# Patient Record
Sex: Male | Born: 1970 | Race: White | Hispanic: No | Marital: Married | Smoking: Never smoker
Health system: Southern US, Community
[De-identification: ages and names within clinical notes are randomized; demographics above are authoritative.]

## PROBLEM LIST (undated history)

## (undated) DIAGNOSIS — F32A Depression, unspecified: Secondary | ICD-10-CM

## (undated) DIAGNOSIS — F329 Major depressive disorder, single episode, unspecified: Secondary | ICD-10-CM

## (undated) DIAGNOSIS — B019 Varicella without complication: Secondary | ICD-10-CM

## (undated) DIAGNOSIS — K635 Polyp of colon: Secondary | ICD-10-CM

## (undated) DIAGNOSIS — E669 Obesity, unspecified: Secondary | ICD-10-CM

## (undated) DIAGNOSIS — R7303 Prediabetes: Secondary | ICD-10-CM

## (undated) DIAGNOSIS — G4730 Sleep apnea, unspecified: Secondary | ICD-10-CM

## (undated) HISTORY — DX: Sleep apnea, unspecified: G47.30

## (undated) HISTORY — DX: Major depressive disorder, single episode, unspecified: F32.9

## (undated) HISTORY — DX: Depression, unspecified: F32.A

## (undated) HISTORY — DX: Varicella without complication: B01.9

## (undated) HISTORY — DX: Polyp of colon: K63.5

## (undated) HISTORY — DX: Obesity, unspecified: E66.9

---

## 1996-12-04 HISTORY — PX: ANKLE FRACTURE SURGERY: SHX122

## 1996-12-04 HISTORY — PX: KNEE ARTHROSCOPY W/ ACL RECONSTRUCTION AND PATELLA GRAFT: SHX1861

## 2006-12-04 DIAGNOSIS — G4730 Sleep apnea, unspecified: Secondary | ICD-10-CM

## 2006-12-04 HISTORY — DX: Sleep apnea, unspecified: G47.30

## 2010-05-20 ENCOUNTER — Ambulatory Visit: Payer: Self-pay | Admitting: Family Medicine

## 2010-05-20 DIAGNOSIS — Z9189 Other specified personal risk factors, not elsewhere classified: Secondary | ICD-10-CM | POA: Insufficient documentation

## 2010-05-20 DIAGNOSIS — L259 Unspecified contact dermatitis, unspecified cause: Secondary | ICD-10-CM

## 2010-05-20 DIAGNOSIS — F329 Major depressive disorder, single episode, unspecified: Secondary | ICD-10-CM

## 2010-06-03 ENCOUNTER — Telehealth: Payer: Self-pay | Admitting: Family Medicine

## 2010-06-07 ENCOUNTER — Telehealth: Payer: Self-pay | Admitting: Family Medicine

## 2010-09-21 ENCOUNTER — Encounter (INDEPENDENT_AMBULATORY_CARE_PROVIDER_SITE_OTHER): Payer: Self-pay | Admitting: *Deleted

## 2010-10-07 ENCOUNTER — Ambulatory Visit: Payer: Self-pay | Admitting: Internal Medicine

## 2010-10-07 DIAGNOSIS — R071 Chest pain on breathing: Secondary | ICD-10-CM

## 2010-10-11 ENCOUNTER — Ambulatory Visit: Payer: Self-pay | Admitting: Internal Medicine

## 2010-10-12 LAB — CONVERTED CEMR LAB
ALT: 24 units/L (ref 0–53)
BUN: 21 mg/dL (ref 6–23)
CO2: 31 meq/L (ref 19–32)
Chloride: 103 meq/L (ref 96–112)
Cholesterol: 156 mg/dL (ref 0–200)
Creatinine, Ser: 0.9 mg/dL (ref 0.4–1.5)
Eosinophils Relative: 4.1 % (ref 0.0–5.0)
Glucose, Bld: 99 mg/dL (ref 70–99)
MCV: 88.4 fL (ref 78.0–100.0)
Monocytes Absolute: 0.4 10*3/uL (ref 0.1–1.0)
Monocytes Relative: 8.1 % (ref 3.0–12.0)
Neutrophils Relative %: 63 % (ref 43.0–77.0)
Platelets: 257 10*3/uL (ref 150.0–400.0)
TSH: 2.03 microintl units/mL (ref 0.35–5.50)
Total Protein: 6.5 g/dL (ref 6.0–8.3)
Triglycerides: 77 mg/dL (ref 0.0–149.0)
WBC: 4.6 10*3/uL (ref 4.5–10.5)

## 2011-01-03 NOTE — Assessment & Plan Note (Signed)
Summary: chest pain since yesterday/alc   Vital Signs:  Patient profile:   40 year old male Weight:      355.75 pounds Temp:     98.6 degrees F oral Pulse rate:   76 / minute Pulse rhythm:   regular BP sitting:   118 / 80  (left arm) Cuff size:   large  Vitals Entered By: Selena Batten Dance CMA Duncan Dull) (October 07, 2010 12:25 PM) CC: Sharp chest pain when taking a deep breath    History of Present Illness: CC: chest pain  Last night 5pm had constant chest pain that lasted several hours on and off.  Felt sharp pain R sided next to sternum traveling underneath arm.  Driving when started.  not exertional, not relieved by rest.  No nausea/vomiting, no diaphoresis, tightness/pressure.  + pleuritic with deep breaths.  stretching R arm makes pain better.  Coughing/sneezing makes it worse.  Has tried ibuprofen so far which took edge off.  No coughing, SOB, fever.  No recent increase in exercise or heavy lifting.  Does walk on treadmill 15 min to warm up, does this a few times a week, no pain there.  no recent viral infection.  Still present with deep breaths only - sharp pain R lower sternum.  Never had anything like this before.  pulling muscle in back in past had similar effect.  Risk factors - obesity, steady at 355.  slowly increasing exercise.  told has normal cholesterol levels.  not HTN, DM, CRI.  No smokers at home.  no early fmhx CAD/MI.    Awaiting records from [place].  No fmhx blood clots, no recent immobility.  Current Medications (verified): 1)  Venlafaxine Hcl 75 Mg Tabs (Venlafaxine Hcl) .... One Tablet 2 Times Daily 2)  Triamcinolone Acetonide 0.5 % Crea (Triamcinolone Acetonide) .... Aaa Two Times A Day X 2 Weeks For Flares  Allergies (verified): No Known Drug Allergies  Past History:  Family History: Last updated: 05/20/2010 father: colon cancer..age 68 mother: healthy siblings: depression  Social History: Last updated: 05/20/2010 Occupation: Barrister's clerk Married 2 children: healthy Never Smoked Alcohol use-yes...2 drinks per month Drug use-no Regular exercise-yes.Marland Kitchen2-3 times a week..walking Diet: fruits and veggies, lean protein, water  Past Medical History: obesity PMH-FH-SH reviewed for relevance  Review of Systems       per HPI  Physical Exam  General:  morbidly obese in ,  VSS Head:  Normocephalic and atraumatic without obvious abnormalities. No apparent alopecia or balding. Mouth:  Oral mucosa and oropharynx without lesions or exudates.  Teeth in good repair. Neck:  no carotid bruit or thyromegaly no cervical or supraclavicular lymphadenopathy  Lungs:  Normal respiratory effort, chest expands symmetrically. Lungs are clear to auscultation, no crackles or wheezes. Heart:  Normal rate and regular rhythm. S1 and S2 normal without gallop, murmur, click, rub or other extra sounds. Abdomen:  soft, NTND, obese Pulses:  2+ rad pulses   Impression & Recommendations:  Problem # 1:  CHEST PAIN, PLEURITIC (ICD-786.52) noncardiac.  CXR to ensure not pulmonary in nature.  treat as MSK with flexeril, naprosyn.  RTC next week if not improved.  RTC for blood work to risk stratify (awaiting records from [place], not in chart).  bp good today.  CXR looks ok to my read, sent patient home.  will call if abnormal by radiology  The following medications were removed from the medication list:    Oxaprozin 600 Mg Tabs (Oxaprozin) ..... Once daily  Etodolac 500 Mg Tabs (Etodolac) ..... Not taking right now His updated medication list for this problem includes:    Naprosyn 500 Mg Tabs (Naproxen) .Marland Kitchen... Take one by mouth two times a day x 5 days then as needed  Orders: T-2 View CXR (71020TC)  Complete Medication List: 1)  Venlafaxine Hcl 75 Mg Tabs (Venlafaxine hcl) .... One tablet 2 times daily 2)  Triamcinolone Acetonide 0.5 % Crea (Triamcinolone acetonide) .... Aaa two times a day x 2 weeks for flares 3)  Flexeril 10 Mg Tabs  (Cyclobenzaprine hcl) .... Take one by mouth two times a day x 5 days then as needed (sedation) 4)  Naprosyn 500 Mg Tabs (Naproxen) .... Take one by mouth two times a day x 5 days then as needed  Patient Instructions: 1)  this sounds muscular. return next week if not better. 2)  treat with flexeril and naprosyn daily for next 5 days then as needed.  watch out for sedation with flexeril. 3)  Return next week fasting (nothing to eat or drink after midnight) for blood work [FLP, CMP, TSH, CBC 278.00, 786.52] 4)  continue stretching of chest. Prescriptions: NAPROSYN 500 MG TABS (NAPROXEN) take one by mouth two times a day x 5 days then as needed  #30 x 0   Entered and Authorized by:   Eustaquio Boyden  MD   Signed by:   Eustaquio Boyden  MD on 10/07/2010   Method used:   Electronically to        [facility] ([facility]ail)       [address]       [place], [place]  [address]       Ph: [phone]       Fax: [phone]   RxID:   [id number] FLEXERIL 10 MG TABS (CYCLOBENZAPRINE HCL) take one by mouth two times a day x 5 days then as needed (sedation)  #30 x 0   Entered and Authorized by:   Eustaquio Boyden  MD   Signed by:   Eustaquio Boyden  MD on 10/07/2010   Method used:   Electronically to        [facility] (ret[facility])       [address]       [place], [place]  [address]       Ph: [phone]       Fax: [phone]   RxID:   [id number]    Orders Added: 1)  Est. Patient Level III [06301] 2)  T-2 View CXR [71020TC]    Current Allergies (reviewed today): No known allergies

## 2011-01-03 NOTE — Progress Notes (Signed)
Summary: wants steroid cream  Phone Note Call from Patient Call back at [phone]   Caller: Patient Call For: Kerby Nora MD Summary of Call: Pt is asking for a steroid cream for the rash on his hands.  He has been using cortisone 10 but that isnt helping.  [facility] [address]. Initial call taken by: Lowella Petties CMA,  June 03, 2010 3:46 PM  Follow-up for Phone Call        Patient advised.Consuello Masse CMA   Follow-up by: Benny Lennert CMA (AAMA),  June 03, 2010 5:25 PM    New/Updated Medications: TRIAMCINOLONE ACETONIDE 0.5 % CREA (TRIAMCINOLONE ACETONIDE) AAA two times a day x 2 weeks for flares Prescriptions: TRIAMCINOLONE ACETONIDE 0.5 % CREA (TRIAMCINOLONE ACETONIDE) AAA two times a day x 2 weeks for flares  #30 gm x 0   Entered and Authorized by:   Kerby Nora MD   Signed by:   Kerby Nora MD on 06/03/2010   Method used:   Electronically to        [id number] (retail)       [address]       [place], [place]  [address]       Ph: [phone]       Fax: [phone]   RxID:   [id number]

## 2011-01-03 NOTE — Progress Notes (Signed)
Summary: call a nurse  Phone Note Call from Patient   Summary of Call: Triage Record Num: [id number] Operator: Karenann Cai Patient Name: David Hansen Call Date & Time: 06/05/2010 12:13:07PM Patient Phone: [phone] PCP: Kerby Nora Patient Gender: Male PCP Fax : Patient DOB: [birth year] Practice Name: [facility] Reason for Call: Patient is calling to follow up on prescription: steroid cream. Prescription was to have been sent to [facility] [address] on Friday (06/03/2010). RN advised patient to call office staff on Tuesday (06/07/2010). Protocol(s) Used: Office Note Recommended Outcome per Protocol: Information Noted and Sent to Office Reason for Outcome: Caller information to office Care Advice:  ~ 06/05/2010 12:20:32PM Page 1 of 1 [email or web address] Initial call taken by: Melody Comas,  June 07, 2010 9:27 AM     Appended Document: call a nurse Please verify prescription recieved by Pt..or resend.   Appended Document: call a nurse Prescription sent to wrong pharmacy.Consuello Masse CMA

## 2011-01-03 NOTE — Assessment & Plan Note (Signed)
Summary: NEW PT TO EST/CLE   Vital Signs:  Patient profile:   40 year old male Height:      72 inches Weight:      355.4 pounds BMI:     48.38 Temp:     97.6 degrees F oral Pulse rate:   76 / minute Pulse rhythm:   regular BP sitting:   118 / 80  (left arm) Cuff size:   regular  History of Present Illness: Chief complaint New Patient  Depression, ongoing in last 10 years: well controlled on effexor 75 mg. No SI. Some stress in schedule..sleeping fairly well at night.   Started daypro (oxaprozin) few weeks ago following ankle sprain. Has history of old ankle football injury. Ankle..gradually improving..swelling down, wearing brace.  Thought had allergic reaction to etodolac.. eye itching, swelling around eyes. On Zyrtec.  Eyelids dry, itchy..now on oxaprozin as well..not sure from med or external allergy.  Preventive Screening-Counseling & Management  Alcohol-Tobacco     Smoking Status: never  Caffeine-Diet-Exercise     Does Patient Exercise: yes      Drug Use:  no.    Allergies (verified): No Known Drug Allergies  Past History:  Past medical, surgical, family and social histories (including risk factors) reviewed, and no changes noted (except as noted below).  Past Surgical History: ACL surgery 1998  Family History: Reviewed history and no changes required. father: colon cancer..age 32 mother: healthy siblings: depression  Social History: Reviewed history and no changes required. 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 CV:  Denies chest pain or discomfort. Resp:  Denies shortness of breath. GI:  Denies abdominal pain, bloody stools,  constipation, diarrhea, and indigestion. GU:  Denies dysuria.  Physical Exam  General:  morbidly obeses in NAD  Ears:  External ear exam shows no significant lesions or deformities.  Otoscopic examination reveals clear canals, tympanic membranes are intact bilaterally without bulging, retraction, inflammation or discharge. Hearing is grossly normal bilaterally. Nose:  External nasal examination shows no deformity or inflammation. Nasal mucosa are pink and moist without lesions or exudates. Mouth:  Oral mucosa and oropharynx without lesions or exudates.  Teeth in good repair. Neck:  no carotid bruit or thyromegaly no cervical or supraclavicular lymphadenopathy  Lungs:  Normal respiratory effort, chest expands symmetrically. Lungs are clear to auscultation, no crackles or wheezes. Heart:  Normal rate and regular rhythm. S1 and S2 normal without gallop, murmur, click, rub or other extra sounds. Abdomen:  Bowel sounds positive,abdomen soft and non-tender without masses, organomegaly or hernias noted. Msk:  No deformity or scoliosis noted of thoracic or lumbar spine.   Pulses:  R and L posterior tibial pulses are full and equal bilaterally  Extremities:  no edmea  Neurologic:  No cranial nerve deficits noted. Station and gait are normal. Plantar reflexes are down-going bilaterally. DTRs are symmetrical throughout. Sensory, motor and coordinative functions appear intact. Skin:  dry flacky skin on eyelids  Raised erythematous patches on backs of hands, flacky Psych:  Cognition and judgment appear intact. Alert and cooperative with normal attention span and concentration. No apparent delusions, illusions, hallucinations   Impression & Recommendations:  Problem # 1:  DEPRESSION (ICD-311) Well controlled. Continue current medication.  His updated medication  list for this problem includes:    Venlafaxine Hcl 75 Mg Tabs (Venlafaxine hcl) ..... One tablet 2 times daily    Hydroxyzine Hcl 10 Mg Tabs  (Hydroxyzine hcl) ..... Once daily  Problem # 2:  DERMATITIS, HANDS (ICD-692.9) Using polysporin and vaseline.  Recommended 2% hydrocortisone cream two times a day OTC.Marland Kitchenif minimal improviement with treat with stronger steroid cream.   Complete Medication List: 1)  Venlafaxine Hcl 75 Mg Tabs (Venlafaxine hcl) .... One tablet 2 times daily 2)  Oxaprozin 600 Mg Tabs (Oxaprozin) .... Once daily 3)  Hydroxyzine Hcl 10 Mg Tabs (Hydroxyzine hcl) .... Once daily 4)  Etodolac 500 Mg Tabs (Etodolac) .... Not taking right now  Patient Instructions: 1)  Please schedule a follow-up appointment in 1 year.  2)  Call if interested in stronger steroid cream for hands.  3)  It is important that you exercise reguarly at least 20 minutes 5 times a week. If you develop chest pain, have severe difficulty breathing, or feel very tired, stop exercising immediately and seek medical attention.  4)  You need to lose weight. Consider a lower calorie diet and regular exercise.   Current Allergies (reviewed today): No known allergies

## 2011-01-03 NOTE — Miscellaneous (Signed)
Summary: [facility] ([facility])  Clinical Lists Changes  Observations: Added new observation of FLU VAX: Historical([facility]) (09/13/2010 14:09)      Immunization History:  Influenza Immunization History:    Influenza:  historical([facility]) (09/13/2010)

## 2011-02-15 ENCOUNTER — Telehealth: Payer: Self-pay | Admitting: Family Medicine

## 2011-02-21 NOTE — Progress Notes (Signed)
Summary: refills needed for effexor XR  Phone Note From Pharmacy   Caller: [facility] Summary of Call: Pt needs refills on venlafaxine ER 75 mg's.  He states he takes one a day, chart has that he takes plain, 2 a day.  [facility]. Plain effexor was just sent to pharmacy    Lowella Petties CMA, AAMA  February 15, 2011 4:08 PM     Follow-up for Phone Call        Pharmacy advised not to fell plain right now Follow-up by: Benny Lennert CMA Duncan Dull),  February 15, 2011 4:20 PM  Additional Follow-up for Phone Call Additional follow up Details #1::        An error was brought to my attention by Ms. Lowella Petties.   This plain effexor was sent to pharmacy incorrectly by a different CMA Benny Lennert), when this note was meant to be held until Dr. Ermalene Searing could evaluate this medication discrepancy in its entirety.  I think this needs to be brought up and explored further. At this point, pharmacy has been called and medication refill has been recinded.  Additional Follow-up by: Hannah Beat MD,  February 15, 2011 5:29 PM    Additional Follow-up for Phone Call Additional follow up Details #2::    We did not place this pt on his med... so.. it was likely entered in computer incorrectly at new pt visit... Heather please call pt to see if he is taking effexor XR once daily or if it really is effexor short acting only once daily. I would recommend to him that if he is taking the latter that he is only gettiong  1/2 day's coverege.  Follow-up by: Kerby Nora MD,  February 15, 2011 11:02 PM  Additional Follow-up for Phone Call Additional follow up Details #3:: Details for Additional Follow-up Action Taken: Patient said that he takes effexor xr 75mg  once a day and medication list updated  as this has been verified, will refill for cross-coverage for aeb. Monty Spicher MD  February 16, 2011 12:19 PM  Additional Follow-up by: Benny Lennert CMA Duncan Dull),  February 16, 2011 8:20  AM  New/Updated Medications: EFFEXOR XR 75 MG XR24H-CAP (VENLAFAXINE HCL) take one tablet daily Prescriptions: EFFEXOR XR 75 MG XR24H-CAP (VENLAFAXINE HCL) take one tablet daily  #30 x 2   Entered and Authorized by:   Hannah Beat MD   Signed by:   Hannah Beat MD on 02/16/2011   Method used:   Electronically to        [facility] ([facility])       [address]       [place], [place]  [address]       Ph: [phone]       Fax: [phone]   RxID:   [id number]

## 2011-05-09 ENCOUNTER — Other Ambulatory Visit: Payer: Self-pay | Admitting: *Deleted

## 2011-05-09 NOTE — Telephone Encounter (Signed)
Ok to refill 

## 2011-05-09 NOTE — Telephone Encounter (Signed)
Due for CPX, have him schedule. May refill prescription until appt date.

## 2011-05-10 NOTE — Telephone Encounter (Signed)
Left message on machine at home for patient to call and schedule CPX.

## 2011-05-16 ENCOUNTER — Encounter: Payer: Self-pay | Admitting: Family Medicine

## 2011-05-17 ENCOUNTER — Encounter: Payer: Self-pay | Admitting: Gastroenterology

## 2011-05-17 ENCOUNTER — Ambulatory Visit (INDEPENDENT_AMBULATORY_CARE_PROVIDER_SITE_OTHER)
Admission: RE | Admit: 2011-05-17 | Discharge: 2011-05-17 | Disposition: A | Discharge status: Home / Self Care | Payer: PRIVATE HEALTH INSURANCE | Source: Ambulatory Visit | Attending: Family Medicine | Admitting: Family Medicine

## 2011-05-17 ENCOUNTER — Ambulatory Visit (INDEPENDENT_AMBULATORY_CARE_PROVIDER_SITE_OTHER): Payer: PRIVATE HEALTH INSURANCE | Admitting: Family Medicine

## 2011-05-17 ENCOUNTER — Encounter: Payer: Self-pay | Admitting: Family Medicine

## 2011-05-17 VITALS — BP 120/78 | HR 76 | Temp 98.2°F | Ht 72.0 in | Wt 356.8 lb

## 2011-05-17 DIAGNOSIS — M25561 Pain in right knee: Secondary | ICD-10-CM

## 2011-05-17 DIAGNOSIS — M25569 Pain in unspecified knee: Secondary | ICD-10-CM

## 2011-05-17 DIAGNOSIS — F329 Major depressive disorder, single episode, unspecified: Secondary | ICD-10-CM

## 2011-05-17 DIAGNOSIS — Z1211 Encounter for screening for malignant neoplasm of colon: Secondary | ICD-10-CM

## 2011-05-17 DIAGNOSIS — Z800 Family history of malignant neoplasm of digestive organs: Secondary | ICD-10-CM

## 2011-05-17 DIAGNOSIS — F3289 Other specified depressive episodes: Secondary | ICD-10-CM

## 2011-05-17 MED ORDER — VENLAFAXINE HCL ER 37.5 MG PO CP24: 37.5000 mg | ORAL_CAPSULE | Freq: Every day | ORAL | Status: DC

## 2011-05-17 MED ORDER — DICLOFENAC SODIUM 75 MG PO TBEC: 75.0000 mg | DELAYED_RELEASE_TABLET | Freq: Two times a day (BID) | ORAL | Status: AC

## 2011-05-17 NOTE — Patient Instructions (Addendum)
Resume rehab Monday  Ice knee also if hurting   Effexor taper: Take 1 effexor 37.5 mg tablet po daily for 2 weeks, then 1 every other day for 2 weeks, then stop  REFERRAL: GO THE THE FRONT ROOM AT THE ENTRANCE OF OUR CLINIC, NEAR CHECK IN. ASK FOR MARION. SHE WILL HELP YOU SET UP YOUR REFERRAL. DATE: TIME:

## 2011-05-17 NOTE — Progress Notes (Signed)
40 year old male:  Effexor - wants to taper off Lexapro for 4-5 years ago. Then effexor for 5 years or so Reports no "real depression" had some decreased interest and activities  Patient presents with 3 week day h/o r sided knee pain after no specific injury. Was on the floor with children. No audible pop was heard. The patient has had an effusion. No symptomatic giving-way. No mechanical clicking. Some crepitus. Joint has not locked up. Patient has been able to walk but is limping. The patient does not have pain going up and down stairs or rising from a seated position.   Pain location: posterior and deep Current physical activity: minimal Prior Knee Surgery: R ACL, BTB graft Current pain meds: motrin Bracing: none  ACL reconstruction, 1992 - patellar graft 3-4 weeks ago, on the right knee, would hear some popping and clicking on the R knee. Now having some pain in his right knee.   Patient Active Problem List  Diagnoses  . OBESITY, MORBID  . DEPRESSION  . DERMATITIS, HANDS  . CHEST PAIN, PLEURITIC  . CHICKENPOX, HX OF  . Family history of colon cancer  . Colon cancer screening   Past Medical History  Diagnosis Date  . Obesity    Past Surgical History  Procedure Date  . Knee arthroscopy w/ acl reconstruction and patella graft 1998    [facility] in [place]   History  Substance Use Topics  . Smoking status: Never Smoker   . Smokeless tobacco: Not on file  . Alcohol Use: Yes   Family History  Problem Relation Age of Onset  . Cancer Father 14    colon cancer   No Known Allergies Current Outpatient Prescriptions on File Prior to Visit  Medication Sig Dispense Refill  . triamcinolone (KENALOG) 0.5 % cream Apply 1 application topically 2 (two) times daily. For flares       . DISCONTD: venlafaxine (EFFEXOR-XR) 75 MG 24 hr capsule Take 75 mg by mouth daily.         REVIEW OF SYSTEMS  GEN: No fevers, chills. Nontoxic. Primarily MSK c/o today. MSK: Detailed in the  HPI GI: tolerating PO intake without difficulty Neuro: No numbness, parasthesias, or tingling associated. Otherwise the pertinent positives of the ROS are noted above.    Physical Exam  Blood pressure 120/78, pulse 76, temperature 98.2 F (36.8 C), temperature source Oral, height 6' (1.829 m), weight 356 lb 12.8 oz (161.843 kg), SpO2 98.00%.  GEN: Well-developed,well-nourished,in no acute distress; alert,appropriate and cooperative throughout examination HEENT: Normocephalic and atraumatic without obvious abnormalities. Ears, externally no deformities PULM: Breathing comfortably in no respiratory distress EXT: No clubbing, cyanosis, or edema PSYCH: Normally interactive. Cooperative during the interview. Pleasant. Friendly and conversant. Not anxious or depressed appearing. Normal, full affect.  Gait: Normal heel toe pattern ROM: 0 - 125 Effusion: R mild - mod Echymosis or edema: none Patellar tendon NT Painful PLICA: neg Patellar grind: negative Medial and lateral patellar facet loading: negative medial and lateral joint lines:NT Mcmurray's neg Flexion-pinch pos Varus and valgus stress: stable Lachman: neg Ant and Post drawer: neg Hip abduction, IR, ER: WNL Hip flexion str: 5/5 Hip abd: 5/5 Quad: 5/5 VMO atrophy:No Hamstring concentric and eccentric: 5/5  A/P: 1. Knee pain, r: suspect meniscal contusion, could be mild OA flare. Unlikely any occult derangement. Activity mod, injection, change to voltaren. Well acquainted with knee rehab from ACL reconstruction distantly  X-rays: AP Bilateral Weight-bearing, Weightbearing Lateral, Sunrise views Indication: knee pain Findings:  relatively well preserved joint space with minimal loss on the R  Knee Injection, R Patient verbally consented to procedure. Risks, benefits, and alternatives explained. Sterilely prepped with betadine. Ethyl cholride used for anesthesia. 9 cc Lidocaine 1% mixed with 1 cc of Kenalog 40 mg injected  using the anterolateral approach without difficulty. No complications with procedure and tolerated well. Patient had decreased pain post-injection.   2. DEP: reviewed how to taper down, start effexor xr 37.5, then taper off  3. Fhx of Colon CA, screening, Father dx at age 59

## 2011-06-02 ENCOUNTER — Other Ambulatory Visit: Payer: Self-pay | Admitting: *Deleted

## 2011-06-02 MED ORDER — TRIAMCINOLONE ACETONIDE 0.5 % EX CREA: 1.0000 "application " | TOPICAL_CREAM | Freq: Two times a day (BID) | CUTANEOUS | Status: DC

## 2011-06-02 NOTE — Telephone Encounter (Signed)
Overdue for CPX.Marland Kitchen Please have him schedule.

## 2011-06-02 NOTE — Telephone Encounter (Signed)
Pt states the rash on his hands has come back and he is requesting a refill.

## 2011-06-02 NOTE — Telephone Encounter (Signed)
Patient advised medication called in and also to return my call the schedule appt

## 2011-06-05 NOTE — Telephone Encounter (Signed)
Patient will call back to schedule appt.

## 2011-06-20 ENCOUNTER — Ambulatory Visit (INDEPENDENT_AMBULATORY_CARE_PROVIDER_SITE_OTHER): Payer: PRIVATE HEALTH INSURANCE | Admitting: Family Medicine

## 2011-06-20 ENCOUNTER — Encounter: Payer: Self-pay | Admitting: Family Medicine

## 2011-06-20 VITALS — BP 120/72 | HR 78 | Temp 98.6°F | Ht 72.0 in | Wt 356.4 lb

## 2011-06-20 DIAGNOSIS — F329 Major depressive disorder, single episode, unspecified: Secondary | ICD-10-CM

## 2011-06-20 DIAGNOSIS — F3289 Other specified depressive episodes: Secondary | ICD-10-CM

## 2011-06-20 DIAGNOSIS — J029 Acute pharyngitis, unspecified: Secondary | ICD-10-CM

## 2011-06-20 NOTE — Progress Notes (Signed)
  Subjective:    Patient ID: David Hansen, male    DOB: 1971-10-17, 40 y.o.   MRN: [id number]  Sore Throat  This is a new problem. The current episode started yesterday. The problem has been gradually worsening. Neither side of throat is experiencing more pain than the other. There has been no fever. The pain is at a severity of 5/10. The pain is moderate. Associated symptoms include congestion and swollen glands. Pertinent negatives include no coughing, drooling, ear discharge, ear pain, headaches, shortness of breath, trouble swallowing or vomiting. Associated symptoms comments: General body ache. He has had no exposure to strep or mono. He has tried gargles (Claritin, salt water gargle) for the symptoms. The treatment provided mild relief.      Review of Systems  Constitutional: Negative for fever and fatigue.  HENT: Positive for congestion. Negative for ear pain, drooling, trouble swallowing and ear discharge.   Eyes: Negative for pain.  Respiratory: Negative for cough and shortness of breath.   Gastrointestinal: Negative for vomiting.  Neurological: Negative for headaches.  Psychiatric/Behavioral: Negative for suicidal ideas, behavioral problems, confusion, dysphoric mood and agitation. The patient is not nervous/anxious.        Doing well tapering off effexor for depression       Objective:   Physical Exam  Constitutional: Vital signs are normal. He appears well-developed and well-nourished.  Non-toxic appearance. He does not appear ill. No distress.  HENT:  Head: Normocephalic and atraumatic.  Right Ear: Hearing, tympanic membrane, external ear and ear canal normal. No tenderness. No foreign bodies. Tympanic membrane is not retracted and not bulging.  Left Ear: Hearing, tympanic membrane, external ear and ear canal normal. No tenderness. No foreign bodies. Tympanic membrane is not retracted and not bulging.  Nose: Nose normal. No mucosal edema or rhinorrhea. Right sinus exhibits  no maxillary sinus tenderness and no frontal sinus tenderness. Left sinus exhibits no maxillary sinus tenderness and no frontal sinus tenderness.  Mouth/Throat: Uvula is midline, oropharynx is clear and moist and mucous membranes are normal. Normal dentition. No dental caries. No oropharyngeal exudate or tonsillar abscesses.  Eyes: Conjunctivae, EOM and lids are normal. Pupils are equal, round, and reactive to light. No foreign bodies found.  Neck: Trachea normal, normal range of motion and phonation normal. Neck supple. Carotid bruit is not present. No mass and no thyromegaly present.  Cardiovascular: Normal rate, regular rhythm, S1 normal, S2 normal, normal heart sounds, intact distal pulses and normal pulses.  Exam reveals no gallop.   No murmur heard. Pulmonary/Chest: Effort normal and breath sounds normal. No respiratory distress. He has no wheezes. He has no rhonchi. He has no rales.  Abdominal: Soft. Normal appearance and bowel sounds are normal. There is no hepatosplenomegaly. There is no tenderness. There is no rebound, no guarding and no CVA tenderness. No hernia.  Neurological: He is alert. He has normal reflexes.  Skin: Skin is warm, dry and intact. No rash noted.  Psychiatric: He has a normal mood and affect. His speech is normal and behavior is normal. Judgment normal.          Assessment & Plan:

## 2011-06-20 NOTE — Assessment & Plan Note (Signed)
Strep negative. Symtpmatic care. Follow up as needed.

## 2011-06-20 NOTE — Assessment & Plan Note (Signed)
Doing well taperong off this medicaiton.

## 2011-06-20 NOTE — Patient Instructions (Signed)
V[facility]: Start tylenol or ibuprofen for sore throat and body pains. Rest, push fluids Expect symptoms to last 7-10 days, call if not gradually turning the corner by day 5 of illness. May develop more congestion or cough before better. Call if difficulty swallowing liquids and solids or shortness of breath or sooner if other questions.

## 2011-06-23 ENCOUNTER — Ambulatory Visit (AMBULATORY_SURGERY_CENTER): Payer: PRIVATE HEALTH INSURANCE | Admitting: *Deleted

## 2011-06-23 VITALS — Ht 72.0 in | Wt 349.0 lb

## 2011-06-23 DIAGNOSIS — Z1211 Encounter for screening for malignant neoplasm of colon: Secondary | ICD-10-CM

## 2011-06-23 MED ORDER — PEG-KCL-NACL-NASULF-NA ASC-C 100 G PO SOLR: ORAL | Status: DC

## 2011-06-27 ENCOUNTER — Encounter: Payer: Self-pay | Admitting: Gastroenterology

## 2011-07-07 ENCOUNTER — Other Ambulatory Visit: Payer: PRIVATE HEALTH INSURANCE | Admitting: Gastroenterology

## 2011-07-27 ENCOUNTER — Ambulatory Visit (AMBULATORY_SURGERY_CENTER): Payer: PRIVATE HEALTH INSURANCE | Admitting: Gastroenterology

## 2011-07-27 ENCOUNTER — Encounter: Payer: Self-pay | Admitting: Gastroenterology

## 2011-07-27 VITALS — BP 125/63 | HR 64 | Temp 97.7°F | Resp 16 | Ht 72.0 in | Wt 349.0 lb

## 2011-07-27 DIAGNOSIS — Z800 Family history of malignant neoplasm of digestive organs: Secondary | ICD-10-CM

## 2011-07-27 DIAGNOSIS — D126 Benign neoplasm of colon, unspecified: Secondary | ICD-10-CM

## 2011-07-27 DIAGNOSIS — Z1211 Encounter for screening for malignant neoplasm of colon: Secondary | ICD-10-CM

## 2011-07-27 DIAGNOSIS — K635 Polyp of colon: Secondary | ICD-10-CM

## 2011-07-27 HISTORY — DX: Polyp of colon: K63.5

## 2011-07-27 HISTORY — PX: COLONOSCOPY: SHX174

## 2011-07-27 MED ORDER — SODIUM CHLORIDE 0.9 % IV SOLN: 500.0000 mL | INTRAVENOUS | Status: DC

## 2011-07-27 NOTE — Patient Instructions (Signed)
Polyp removed today. Repeat test in 5 years. Please refer to blue and green discharge instruction sheets for safety and diet precautions today.

## 2011-07-28 ENCOUNTER — Telehealth: Payer: Self-pay

## 2011-07-28 NOTE — Telephone Encounter (Signed)
I called the home # and got the answering machine.  Called Cell # message picked up, but no ID.  Called the work # and ID was on voice mail and left message to call if any questions or concerns.  MAW

## 2011-08-01 ENCOUNTER — Encounter: Payer: Self-pay | Admitting: Gastroenterology

## 2011-09-15 ENCOUNTER — Other Ambulatory Visit: Payer: Self-pay | Admitting: Family Medicine

## 2011-11-04 IMAGING — CR DG KNEE 1-2V*R*
2 series · 2 of 2 positions shown · non-contrast
Comparison: 05/17/2011

CLINICAL DATA: Right knee pain.  History of ACL reconstruction.

RIGHT KNEE - 1-2 VIEW

[view not recorded (1 of 2)]
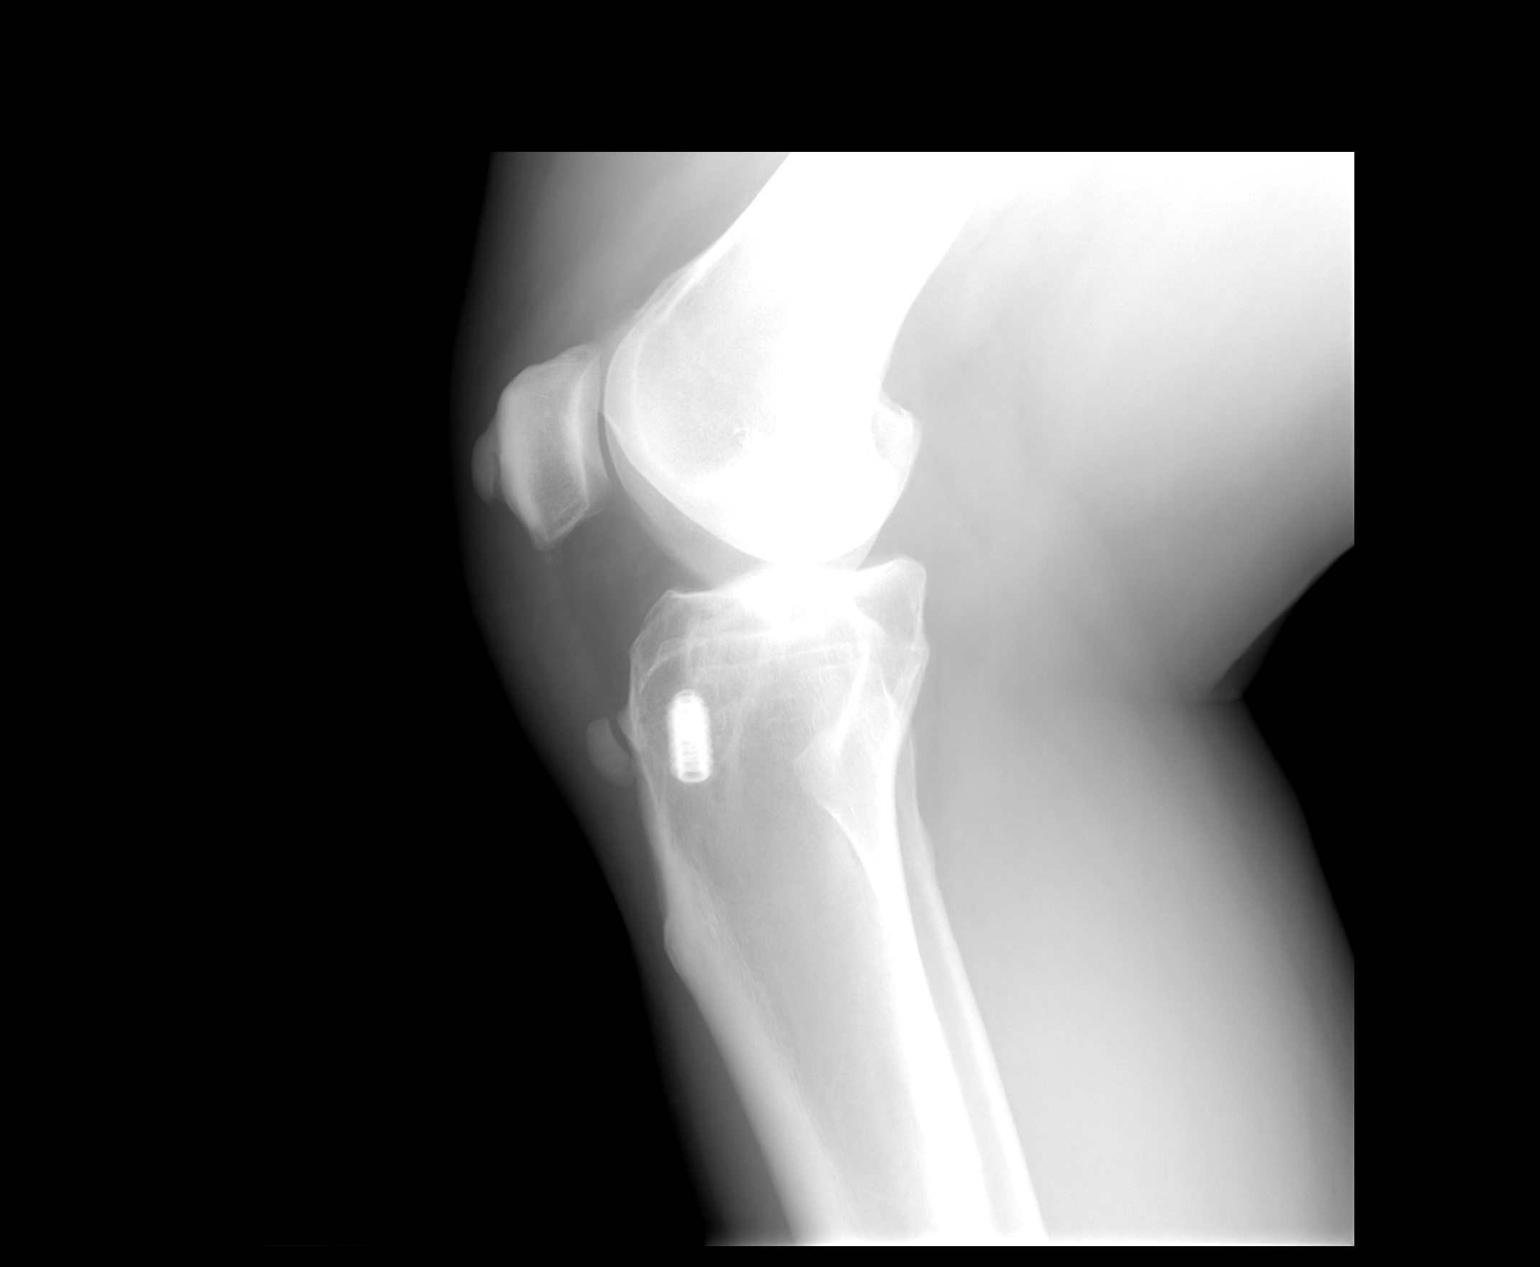

[view not recorded (2 of 2)]
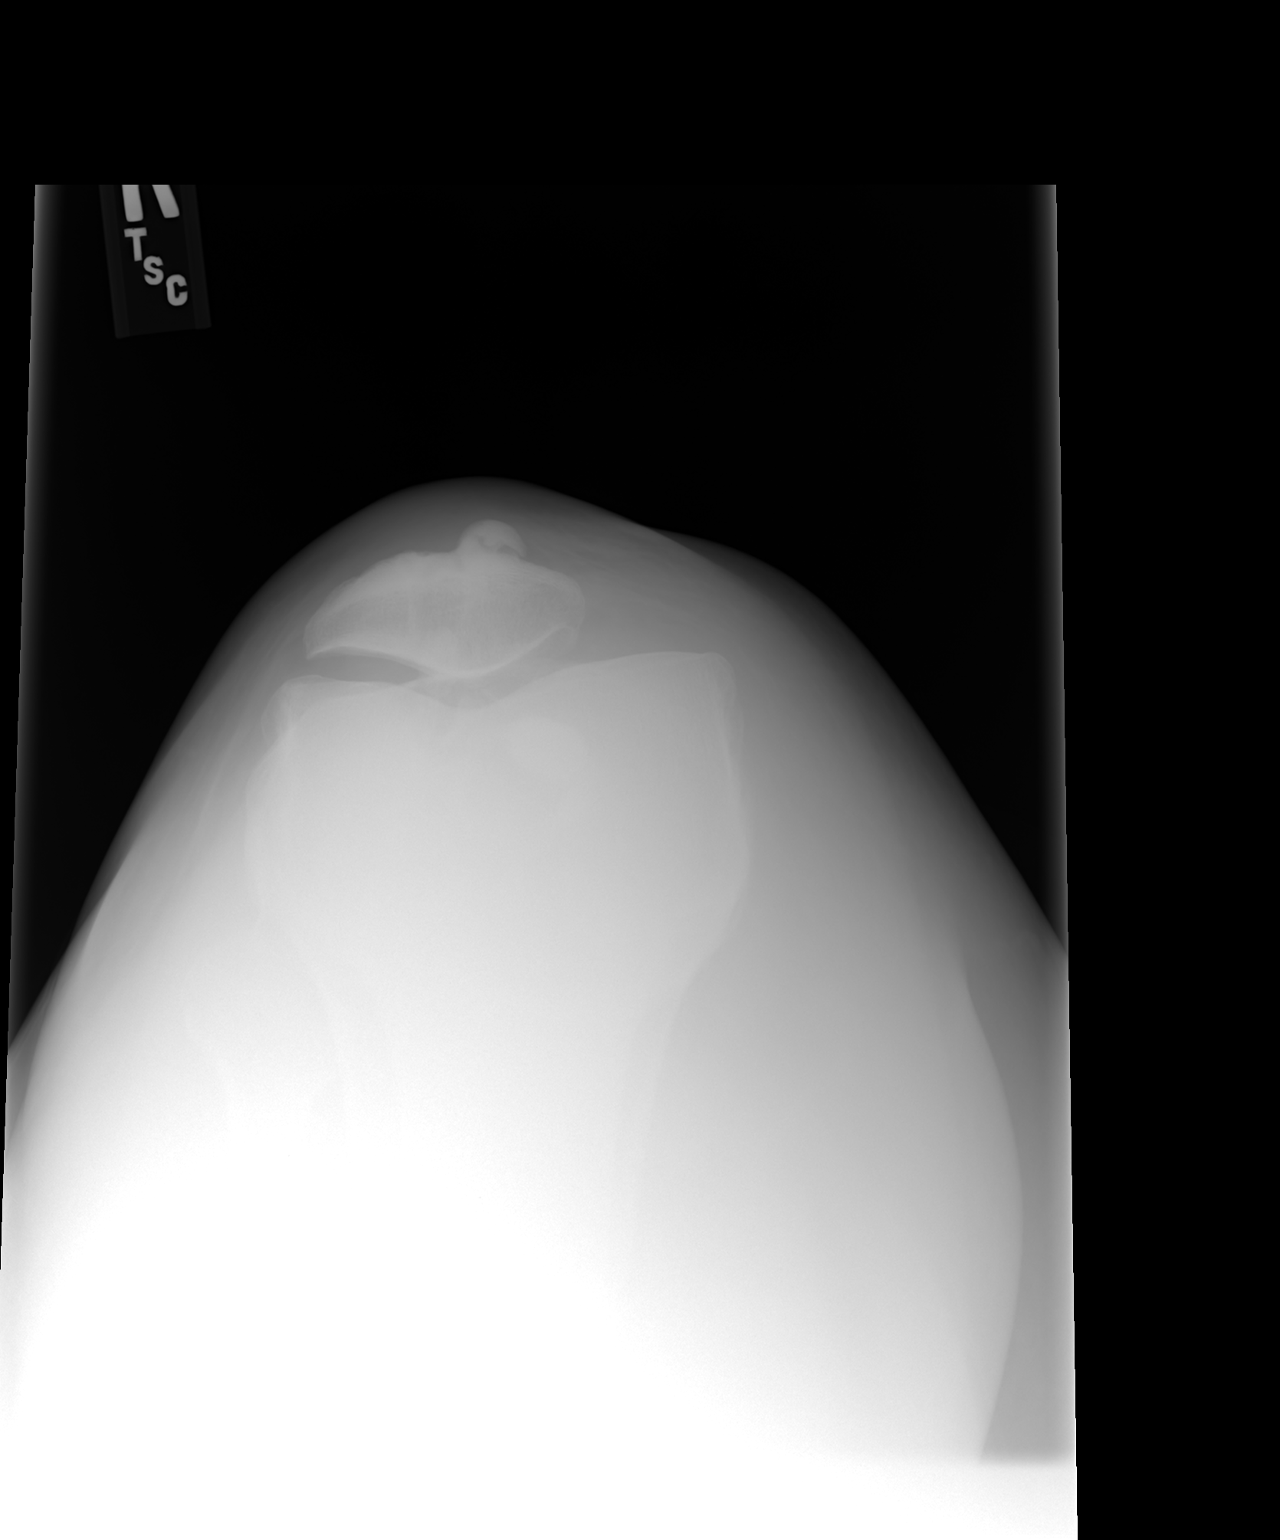

[2 of 2 positions shown; findings below may reference images not displayed]

FINDINGS: Lateral and sunrise view of the right knee demonstrate
postsurgical changes involving the proximal tibia with an anterior
screw placement.  Moderate degenerative changes of the
patellofemoral compartment. The patella is located.  No significant
knee joint effusion.  Post-traumatic changes or enthesopathic
changes involving the anterior aspect of the patella.  Prominent
ossification along the tibial tuberosity suggestive for chronic
changes.
IMPRESSION: Osteoarthritis involving the patellofemoral compartment of the
knee.

Postsurgical changes and possible post-traumatic changes involving
the right knee.

## 2012-06-26 ENCOUNTER — Emergency Department: Payer: Self-pay | Admitting: Emergency Medicine

## 2012-07-04 ENCOUNTER — Ambulatory Visit (INDEPENDENT_AMBULATORY_CARE_PROVIDER_SITE_OTHER): Payer: PRIVATE HEALTH INSURANCE | Admitting: Family Medicine

## 2012-07-04 ENCOUNTER — Encounter: Payer: Self-pay | Admitting: Family Medicine

## 2012-07-04 VITALS — BP 108/76 | HR 72 | Temp 98.6°F | Wt 354.2 lb

## 2012-07-04 DIAGNOSIS — M549 Dorsalgia, unspecified: Secondary | ICD-10-CM | POA: Insufficient documentation

## 2012-07-04 DIAGNOSIS — R51 Headache: Secondary | ICD-10-CM

## 2012-07-04 DIAGNOSIS — M542 Cervicalgia: Secondary | ICD-10-CM | POA: Insufficient documentation

## 2012-07-04 DIAGNOSIS — R519 Headache, unspecified: Secondary | ICD-10-CM | POA: Insufficient documentation

## 2012-07-04 DIAGNOSIS — M545 Low back pain, unspecified: Secondary | ICD-10-CM

## 2012-07-04 HISTORY — DX: Cervicalgia: M54.2

## 2012-07-04 HISTORY — DX: Dorsalgia, unspecified: M54.9

## 2012-07-04 NOTE — Assessment & Plan Note (Signed)
MSK strain. Heat, massage, stretching info given.

## 2012-07-04 NOTE — Assessment & Plan Note (Signed)
Possible mild concussion, limit brain activity. Follow up if not improving. nml neuro exam.

## 2012-07-04 NOTE — Patient Instructions (Addendum)
Ibuprofen as needed. Gentle stretching , massage and heat on upper and lower back.  Limit brain activity such as TV, computer, reading.

## 2012-07-04 NOTE — Assessment & Plan Note (Signed)
MSK strain. Heat, massage, stretching info given. 
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
## 2012-07-19 ENCOUNTER — Other Ambulatory Visit: Payer: Self-pay

## 2012-07-19 NOTE — Telephone Encounter (Signed)
Will route to PCP 

## 2012-07-19 NOTE — Telephone Encounter (Signed)
Pt request refill on diazepam to [facility]. Ibuprofen causes pt indigestion;pt wants different med called to [facility].

## 2012-07-21 MED ORDER — MELOXICAM 15 MG PO TABS: 15.0000 mg | ORAL_TABLET | Freq: Every day | ORAL | Status: DC

## 2012-07-21 MED ORDER — DIAZEPAM 5 MG PO TABS: 5.0000 mg | ORAL_TABLET | Freq: Every evening | ORAL | Status: DC | PRN

## 2012-07-22 NOTE — Telephone Encounter (Signed)
rx called to pharmacy 

## 2012-07-23 ENCOUNTER — Encounter: Payer: Self-pay | Admitting: Family Medicine

## 2012-08-01 ENCOUNTER — Telehealth: Payer: Self-pay

## 2012-08-01 NOTE — Telephone Encounter (Signed)
Not okay to refill if he is also taking meloxicam. Cannot take both, please verify.

## 2012-08-01 NOTE — Telephone Encounter (Signed)
Faxed request from [facility] for Motrin 800 mg. Ok to refill?

## 2012-08-02 NOTE — Telephone Encounter (Signed)
Patient stated that he is not taking the meloxicam  he was taking Diazepam 5mg  and another pain medicine.

## 2012-08-02 NOTE — Telephone Encounter (Signed)
Patient notified as instructed by telephone. 

## 2012-08-02 NOTE — Telephone Encounter (Signed)
Patient notified as instructed by telephone. Pt said Motrin is causing heartburn;pt wants to know if taking OTC Aleve instead of Motrin would be OK?Please advise.

## 2012-08-02 NOTE — Telephone Encounter (Signed)
Okay to refill? 

## 2012-08-02 NOTE — Telephone Encounter (Signed)
Aleve will likely cause symptoms as well. Would instead use tylenol and avoid NSAIDs.

## 2012-12-27 ENCOUNTER — Encounter: Payer: Self-pay | Admitting: Family Medicine

## 2012-12-27 ENCOUNTER — Ambulatory Visit (INDEPENDENT_AMBULATORY_CARE_PROVIDER_SITE_OTHER): Payer: PRIVATE HEALTH INSURANCE | Admitting: Family Medicine

## 2012-12-27 VITALS — BP 124/72 | HR 60 | Temp 97.8°F | Ht 72.0 in | Wt 335.5 lb

## 2012-12-27 DIAGNOSIS — Z800 Family history of malignant neoplasm of digestive organs: Secondary | ICD-10-CM

## 2012-12-27 DIAGNOSIS — Z13220 Encounter for screening for lipoid disorders: Secondary | ICD-10-CM

## 2012-12-27 DIAGNOSIS — Z23 Encounter for immunization: Secondary | ICD-10-CM

## 2012-12-27 LAB — LIPID PANEL
Cholesterol: 149 mg/dL (ref 0–200)
VLDL: 9.8 mg/dL (ref 0.0–40.0)

## 2012-12-27 LAB — COMPREHENSIVE METABOLIC PANEL
ALT: 17 U/L (ref 0–53)
Albumin: 4.3 g/dL (ref 3.5–5.2)
CO2: 30 mEq/L (ref 19–32)
Calcium: 9.4 mg/dL (ref 8.4–10.5)
Chloride: 102 mEq/L (ref 96–112)
GFR: 84.36 mL/min (ref >60.00)
Glucose, Bld: 96 mg/dL (ref 70–99)
Potassium: 4.7 mEq/L (ref 3.5–5.1)
Sodium: 139 mEq/L (ref 135–145)
Total Bilirubin: 0.6 mg/dL (ref 0.3–1.2)
Total Protein: 7.5 g/dL (ref 6.0–8.3)

## 2012-12-27 NOTE — Progress Notes (Signed)
Subjective:    Patient ID: David Hansen, male    DOB: 1971/09/15, 42 y.o.   MRN: [id number]  HPI  The patient is here for annual wellness exam and preventative care.   He is overdue for lab screening and has not had a CPX since 2011.  Morbid obesity. Have been working healthy eating and regular exercise. Body weight exercis three times a week, running drills with son. Wt Readings from Last 3 Encounters:  12/27/12 335 lb 8 oz (152.182 kg)  07/04/12 354 lb 4 oz (160.687 kg)  07/27/11 349 lb (158.305 kg)  Has lost 20 lb in last year.     Review of Systems  Constitutional: Negative for fever, fatigue and unexpected weight change.  HENT: Negative for ear pain, congestion, sore throat, rhinorrhea, trouble swallowing and postnasal drip.   Eyes: Negative for pain.  Respiratory: Negative for cough, shortness of breath and wheezing.   Cardiovascular: Negative for chest pain, palpitations and leg swelling.  Gastrointestinal: Negative for nausea, abdominal pain, diarrhea, constipation and blood in stool.  Genitourinary: Negative for dysuria, urgency, hematuria, discharge, penile swelling, scrotal swelling, difficulty urinating, penile pain and testicular pain.  Skin: Negative for rash.  Neurological: Negative for syncope, weakness, light-headedness, numbness and headaches.  Psychiatric/Behavioral: Negative for behavioral problems and dysphoric mood. The patient is not nervous/anxious.        Objective:   Physical Exam  Constitutional: He appears well-developed and well-nourished.  Non-toxic appearance. He does not appear ill. No distress.       Morbid obesity  HENT:  Head: Normocephalic and atraumatic.  Right Ear: Hearing, tympanic membrane, external ear and ear canal normal.  Left Ear: Hearing, tympanic membrane, external ear and ear canal normal.  Nose: Nose normal.  Mouth/Throat: Uvula is midline, oropharynx is clear and moist and mucous membranes are normal.  Eyes:  Conjunctivae normal, EOM and lids are normal. Pupils are equal, round, and reactive to light. No foreign bodies found.  Neck: Trachea normal, normal range of motion and phonation normal. Neck supple. Carotid bruit is not present. No mass and no thyromegaly present.  Cardiovascular: Normal rate, regular rhythm, S1 normal, S2 normal, intact distal pulses and normal pulses.  Exam reveals no gallop.   No murmur heard. Pulmonary/Chest: Breath sounds normal. He has no wheezes. He has no rhonchi. He has no rales.  Abdominal: Soft. Normal appearance and bowel sounds are normal. There is no hepatosplenomegaly. There is no tenderness. There is no rebound, no guarding and no CVA tenderness. No hernia. Hernia confirmed negative in the right inguinal area and confirmed negative in the left inguinal area.  Genitourinary: Testes normal and penis normal. Right testis shows no mass and no tenderness. Left testis shows no mass and no tenderness. No paraphimosis or penile tenderness.  Lymphadenopathy:    He has no cervical adenopathy.       Right: No inguinal adenopathy present.       Left: No inguinal adenopathy present.  Neurological: He is alert. He has normal strength and normal reflexes. No cranial nerve deficit or sensory deficit. Gait normal.  Skin: Skin is warm, dry and intact. No rash noted.  Psychiatric: He has a normal mood and affect. His speech is normal and behavior is normal. Judgment normal.          Assessment & Plan:  The patient's preventative maintenance and recommended screening tests for an annual wellness exam were reviewed in full today. Brought up to date unless services declined.  Counselled  on the importance of diet, exercise, and its role in overall health and mortality. The patient's FH and SH was reviewed, including their home life, tobacco status, and drug and alcohol status.   Vaccines: due for flu and ? Tdap. Will given both today. Family history of colon cancer in  father   at age 27, last colonscopy 07/2011... polyp.  Dr. Russella Dar Recommends repeating in 5 years (2017). Non smoker No family history of prostate cancer.

## 2012-12-27 NOTE — Patient Instructions (Addendum)
Continue working on regular exercise and healthy eating and weight loss.

## 2013-01-23 ENCOUNTER — Other Ambulatory Visit: Payer: PRIVATE HEALTH INSURANCE

## 2013-01-30 ENCOUNTER — Encounter: Payer: PRIVATE HEALTH INSURANCE | Admitting: Family Medicine

## 2013-04-10 ENCOUNTER — Telehealth: Payer: Self-pay | Admitting: Family Medicine

## 2013-04-10 DIAGNOSIS — J309 Allergic rhinitis, unspecified: Secondary | ICD-10-CM

## 2013-04-10 NOTE — Telephone Encounter (Signed)
Referral made 

## 2013-04-10 NOTE — Telephone Encounter (Signed)
Pt is having recurring issues w/allergies and has taken Claritin, OTC, which does not help at all.  He's wanting a referral to an allergist so he can find out his specific allergies. Can you give him a referral? Or does he need to make an apptmt to see you first? Thank you.

## 2013-07-29 ENCOUNTER — Encounter: Payer: Self-pay | Admitting: Internal Medicine

## 2013-07-29 ENCOUNTER — Ambulatory Visit (INDEPENDENT_AMBULATORY_CARE_PROVIDER_SITE_OTHER): Payer: Managed Care, Other (non HMO) | Admitting: Internal Medicine

## 2013-07-29 VITALS — BP 108/90 | HR 69 | Temp 98.5°F | Wt 346.0 lb

## 2013-07-29 DIAGNOSIS — S6391XA Sprain of unspecified part of right wrist and hand, initial encounter: Secondary | ICD-10-CM | POA: Insufficient documentation

## 2013-07-29 DIAGNOSIS — S6390XA Sprain of unspecified part of unspecified wrist and hand, initial encounter: Secondary | ICD-10-CM

## 2013-07-29 NOTE — Progress Notes (Signed)
  Subjective:    Patient ID: David Hansen, male    DOB: [birth year], 42 y.o.   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 fish oil-omega-3 fatty acids 1000 MG capsule Take 1 g by mouth daily.      Marland Kitchen VITAMIN D, CHOLECALCIFEROL, PO Take by mouth daily.       No current facility-administered medications on file prior to visit.    No Known Allergies  Past Medical History  Diagnosis Date  . Obesity   . Depression   . Sleep apnea     Past Surgical History  Procedure Laterality Date  . Knee arthroscopy w/ acl reconstruction and patella graft  1998    [facility] in [place]  Right knee    Family History  Problem Relation Age of Onset  . Cancer Father 46    colon cancer  . Colon cancer Father     History   Social History  . Marital Status: Married    Spouse Name: N/A    Number of Children: 2  . Years of Education: N/A   Occupational History  . Comptroller    Social History Main Topics  . Smoking status: Never Smoker   . Smokeless tobacco: Never Used  . Alcohol Use: Yes     Comment: rarely  . Drug Use: No  . Sexual Activity: Not on file   Other Topics Concern  . Not on file   Social History Narrative   Regular exercise yes 2-3 times a week...walking      Diet: fruits and veggies,lean protein, water   Review of Systems No clear cut weakness No leg or left hand symptoms    Objective:   Physical Exam  Constitutional: He appears well-developed and well-nourished. No distress.  Musculoskeletal:  Prominence of thenar emminence on right Mild tenderness  over mid right volar wrist ROM is normal  Neurological:  Mild decreased grip strength on right          Assessment & Plan:

## 2013-07-29 NOTE — Patient Instructions (Signed)
Please use the naproxen 220mg  -- 2 tabs twice a day Ice as needed for pain  If you are not better in about 2 weeks, I will refer you to a hand surgeon

## 2013-07-29 NOTE — Assessment & Plan Note (Signed)
Pain along medial nerve but some swelling at base of thumb also  Will try full dose NSAIDs  Ice after using Hand surgeon if not improving

## 2013-09-05 ENCOUNTER — Ambulatory Visit (INDEPENDENT_AMBULATORY_CARE_PROVIDER_SITE_OTHER): Payer: Managed Care, Other (non HMO) | Admitting: Family Medicine

## 2013-09-05 ENCOUNTER — Encounter: Payer: Self-pay | Admitting: Family Medicine

## 2013-09-05 VITALS — BP 112/76 | HR 78 | Temp 98.3°F | Ht 72.0 in | Wt 349.2 lb

## 2013-09-05 DIAGNOSIS — Z23 Encounter for immunization: Secondary | ICD-10-CM

## 2013-09-05 DIAGNOSIS — G4733 Obstructive sleep apnea (adult) (pediatric): Secondary | ICD-10-CM

## 2013-09-05 NOTE — Patient Instructions (Signed)
Work on regular exercise. Keep working on low carb diet.  Stop at front desk to set up pulmonary referral for CPAPand sleep testing.

## 2013-09-05 NOTE — Progress Notes (Signed)
  Subjective:    Patient ID: Tracie Lindbloom, male    DOB: [birth year], 42 y.o.   MRN: [id number]  HPI  42 year old male with morbid obesity presents to discuss sleep apnea.  Dx in 8 years ago at [facility]. He has been on Cpap since, this same machine.  has not had a sleep study since. He reports that he has not been resting as well as usual. Not feeling as rested in AM. Occ machine hissing with poor seal. He feels he made need new parts. He wants to get a new prescription.  BP is well controlled. He has had slow increase in weight in last 8 weight. He has been working on low carb diet. He walks some.   Wt Readings from Last 3 Encounters:  09/05/13 349 lb 4 oz (158.419 kg)  07/29/13 346 lb (156.945 kg)  12/27/12 335 lb 8 oz (152.182 kg)      Review of Systems  Constitutional: Positive for fatigue. Negative for fever.  HENT: Negative for ear pain.   Eyes: Negative for pain.  Respiratory: Negative for shortness of breath and wheezing.   Cardiovascular: Negative for chest pain, palpitations and leg swelling.  Gastrointestinal: Negative for abdominal pain.  Musculoskeletal: Positive for back pain.  Neurological: Negative for syncope and headaches.       Objective:   Physical Exam  Constitutional: He is oriented to person, place, and time. Vital signs are normal. He appears well-developed and well-nourished.  Morbidly obese  HENT:  Head: Normocephalic.  Right Ear: Hearing normal.  Left Ear: Hearing normal.  Nose: Nose normal.  Mouth/Throat: Oropharynx is clear and moist and mucous membranes are normal.  Small oropharynx  Neck: Trachea normal. Carotid bruit is not present. No mass and no thyromegaly present.  Short thick neck  Cardiovascular: Normal rate, regular rhythm and normal pulses.  Exam reveals no gallop, no distant heart sounds and no friction rub.   No murmur heard. No peripheral edema  Pulmonary/Chest: Effort normal and breath sounds normal. No  respiratory distress.  Neurological: He is alert and oriented to person, place, and time.  Skin: Skin is warm, dry and intact. No rash noted.  Psychiatric: He has a normal mood and affect. His speech is normal and behavior is normal. Thought content normal.          Assessment & Plan:

## 2013-09-05 NOTE — Assessment & Plan Note (Signed)
Overdue for  CPAP adjustments . Refer to Pulm sleep specialist.  Discussed working on weight loss and healthy lifetyle to improve sleep apnea.

## 2013-10-17 ENCOUNTER — Telehealth: Payer: Self-pay | Admitting: Family Medicine

## 2013-10-17 NOTE — Telephone Encounter (Signed)
Referral made 

## 2013-10-17 NOTE — Telephone Encounter (Signed)
Pt was seen for hand pain back on 07/29/13.  He took the meds Dr. Alphonsus Sias gave him and it got better.  He grabbed a door knob wrong this weekend and he is having a lot of pain again with his hand and it is "making a clicking noise" when he opens and closes his hand.  Dr. Alphonsus Sias told him he could have a referral to a hand specialist if it did not get better.  He would like a referral.

## 2013-10-20 ENCOUNTER — Encounter: Payer: Self-pay | Admitting: Pulmonary Disease

## 2013-10-20 ENCOUNTER — Ambulatory Visit (INDEPENDENT_AMBULATORY_CARE_PROVIDER_SITE_OTHER): Payer: Managed Care, Other (non HMO) | Admitting: Pulmonary Disease

## 2013-10-20 VITALS — BP 116/84 | HR 70 | Temp 98.2°F | Ht 72.0 in | Wt 348.2 lb

## 2013-10-20 DIAGNOSIS — G4733 Obstructive sleep apnea (adult) (pediatric): Secondary | ICD-10-CM

## 2013-10-20 NOTE — Progress Notes (Signed)
Subjective:    Patient ID: David Hansen, male    DOB: 1971-04-06, 42 y.o.   MRN: [id number]  HPI The patient is a 42 year old male who I've been asked to see for management of obstructive sleep apnea.  He was diagnosed in 2004 with mild OSA, with an AHI of 13 events per hour.  He has been on CPAP since that time and has done very well with the device.  His current machine is over 47 years old, and is now having leaks and not working appropriately.  He is finding that he is not sleeping as well, his wife has noticed breakthrough snoring, and he has developed nonrestorative sleep.  He is having sleep pressure again during the day, and his upper score is 17.  He has gained about 80 pounds since his original sleep study in 2004, 25 of which has been gained in the last 2 years.  He has not had his machine checked or recalibrated since 2004.   Sleep Questionnaire What time do you typically go to bed?( Between what hours) 10-11 10-11 at 1134 on 10/20/13 by Maisie Fus, CMA How long does it take you to fall asleep? 15-106mins 15-79mins at 1134 on 10/20/13 by Maisie Fus, CMA How many times during the night do you wake up? 00 per sleep study--pt comes out of REM sleep multiple times during night but no actual wakings at 1134 on 10/20/13 by Maisie Fus, CMA What time do you get out of bed to start your day? No Value 6-630anm at 1134 on 10/20/13 by Maisie Fus, CMA Do you drive or operate heavy machinery in your occupation? No No at 1134 on 10/20/13 by Maisie Fus, CMA How much has your weight changed (up or down) over the past two years? (In pounds) 25 lb (11.34 kg)25 lb (11.34 kg) increased at 1134 on 10/20/13 by Maisie Fus, CMA Have you ever had a sleep study before? Yes Yes at 1134 on 10/20/13 by Maisie Fus, CMA If yes, location of study? [facility] at 1134 on 10/20/13 by Maisie Fus, CMA If yes, date of study? 2006? 2006? at 1134 on 10/20/13 by Maisie Fus,  CMA Do you currently use CPAP? Yes Yes at 1134 on 10/20/13 by Maisie Fus, CMA If so, what pressure? 20 20 at 1134 on 10/20/13 by Maisie Fus, CMA Do you wear oxygen at any time? No No at 1134 on 10/20/13 by Maisie Fus, CMA   Review of Systems  Constitutional: Negative for fever and unexpected weight change.  HENT: Negative for congestion, dental problem, ear pain, nosebleeds, postnasal drip, rhinorrhea, sinus pressure, sneezing, sore throat and trouble swallowing.   Eyes: Negative for redness and itching.  Respiratory: Negative for cough, chest tightness, shortness of breath and wheezing.   Cardiovascular: Negative for palpitations and leg swelling.  Gastrointestinal: Negative for nausea and vomiting.  Genitourinary: Negative for dysuria.  Musculoskeletal: Negative for joint swelling.  Skin: Negative for rash.  Neurological: Positive for headaches ( waking in AM).  Hematological: Does not bruise/bleed easily.  Psychiatric/Behavioral: Negative for dysphoric mood. The patient is not nervous/anxious.        Objective:   Physical Exam Constitutional:  Morbidly obese male, no acute distress  HENT:  Nares patent without discharge  Oropharynx without exudate, palate and uvula are thick and elongated.   Eyes:  Perrla, eomi, no scleral icterus  Neck:  No JVD, no TMG  Cardiovascular:  Normal  rate, regular rhythm, no rubs or gallops.  No murmurs        Intact distal pulses  Pulmonary :  Normal breath sounds, no stridor or respiratory distress   No rales, rhonchi, or wheezing  Abdominal:  Soft, nondistended, bowel sounds present.  No tenderness noted.   Musculoskeletal:  mild lower extremity edema noted.  Lymph Nodes:  No cervical lymphadenopathy noted  Skin:  No cyanosis noted  Neurologic:  Appears sleepy, but appropriate, moves all 4 extremities without obvious deficit.         Assessment & Plan:

## 2013-10-20 NOTE — Patient Instructions (Signed)
Will get you referred to a homecare company for a new machine and supplies. Work on weight loss Will use the automatic setting in your new machine to optimize your pressure.  You can then decide if you wish to stay on auto vs going on fixed optimal pressure.  I will call once we get your download.  followup with me in one year if doing well.

## 2013-10-20 NOTE — Assessment & Plan Note (Signed)
The patient has a history of obstructive sleep apnea dating back to 2004, and he has been on CPAP successfully since that time.  He has now had issues with his device, and is now sleeping poorly with breakthrough snoring and nonrestorative sleep.  His machine does not appear to be working properly, and therefore will need to be replaced.  He has also gained 80 pounds since his original study, and therefore his pressure needs to be optimized again.  We'll get the patient a new CPAP machine with supplies, and I have encouraged him to work aggressively on weight loss.

## 2013-10-23 ENCOUNTER — Telehealth: Payer: Self-pay | Admitting: Pulmonary Disease

## 2013-10-23 NOTE — Telephone Encounter (Signed)
Pt has several questions for PCC's. I attempted to help him, but I unable to.  [facility] - pt needs a call back. Thanks.

## 2013-10-23 NOTE — Telephone Encounter (Signed)
Spoke to  Pt and [email or web address]  she will be calling pt today pt is aware of this Tobe Sos

## 2013-12-07 ENCOUNTER — Other Ambulatory Visit: Payer: Self-pay | Admitting: Pulmonary Disease

## 2013-12-07 DIAGNOSIS — G4733 Obstructive sleep apnea (adult) (pediatric): Secondary | ICD-10-CM

## 2013-12-08 ENCOUNTER — Telehealth: Payer: Self-pay | Admitting: Pulmonary Disease

## 2013-12-08 NOTE — Telephone Encounter (Signed)
Spoke with pt and is aware he will be set at 11 CM. I advised him Huey Romans should be contacting him. He does have S10 and states they should be able to do this wirelessly. I advised will call [employer] to ensure this was received so this can be taken care of.  I spoke with [place] from [place] and she reports they do not have any order on pt. In Epic this states it was faxed this morning to them. I have re faxed this over to her at [phone]. Nothing further needed

## 2013-12-08 NOTE — Telephone Encounter (Signed)
Spoke with pt. He reports he doesn't feel like the settings are enough for him compared to his last machine. He is still feeling tired during the day. At times he feels like it is hard to breathe when he firsts puts the mask on and he feels like this until the pressure ramps up enough. He believes he is currently on auto setting.    After hanging up the phone with pt. I see [facility] has a note in the referrals dated 12/07/13 his optimal pressure looks like to be 11 cm. [facility] wants to change his auto setting to this. I called pt back x 2 and had to [facility] x1

## 2013-12-08 NOTE — Telephone Encounter (Signed)
PT [phone] CALLING BACK

## 2013-12-21 ENCOUNTER — Telehealth: Payer: Self-pay | Admitting: Family Medicine

## 2013-12-21 DIAGNOSIS — Z13220 Encounter for screening for lipoid disorders: Secondary | ICD-10-CM

## 2013-12-21 NOTE — Telephone Encounter (Signed)
Message copied by Jinny Sanders on Sun Dec 21, 2013 11:18 PM ------      Message from: Selinda Orion J      Created: Thu Dec 11, 2013 11:29 AM      Regarding: Lab orders for Tuesday, 1.20.15       Patient is scheduled for CPX labs, please order future labs, Thanks , Terri       ------

## 2013-12-23 ENCOUNTER — Other Ambulatory Visit (INDEPENDENT_AMBULATORY_CARE_PROVIDER_SITE_OTHER): Payer: Managed Care, Other (non HMO)

## 2013-12-23 DIAGNOSIS — Z13220 Encounter for screening for lipoid disorders: Secondary | ICD-10-CM

## 2013-12-23 LAB — LIPID PANEL
CHOLESTEROL: 166 mg/dL (ref 0–200)
HDL: 39.5 mg/dL (ref >39.00)
LDL Cholesterol: 105 mg/dL — ABNORMAL HIGH (ref 0–99)
Total CHOL/HDL Ratio: 4
Triglycerides: 106 mg/dL (ref 0.0–149.0)
VLDL: 21.2 mg/dL (ref 0.0–40.0)

## 2013-12-23 LAB — COMPREHENSIVE METABOLIC PANEL
ALBUMIN: 4.1 g/dL (ref 3.5–5.2)
ALK PHOS: 37 U/L — AB (ref 39–117)
ALT: 16 U/L (ref 0–53)
AST: 14 U/L (ref 0–37)
BILIRUBIN TOTAL: 0.5 mg/dL (ref 0.3–1.2)
BUN: 15 mg/dL (ref 6–23)
CO2: 31 meq/L (ref 19–32)
Calcium: 9.3 mg/dL (ref 8.4–10.5)
Chloride: 103 mEq/L (ref 96–112)
Creatinine, Ser: 1.1 mg/dL (ref 0.4–1.5)
GFR: 78.65 mL/min (ref >60.00)
Glucose, Bld: 94 mg/dL (ref 70–99)
POTASSIUM: 4.7 meq/L (ref 3.5–5.1)
Sodium: 139 mEq/L (ref 135–145)
Total Protein: 7.5 g/dL (ref 6.0–8.3)

## 2013-12-30 ENCOUNTER — Ambulatory Visit (INDEPENDENT_AMBULATORY_CARE_PROVIDER_SITE_OTHER): Payer: Managed Care, Other (non HMO) | Admitting: Family Medicine

## 2013-12-30 ENCOUNTER — Encounter: Payer: Self-pay | Admitting: Family Medicine

## 2013-12-30 VITALS — BP 110/84 | HR 78 | Temp 98.5°F | Ht 71.0 in | Wt 347.8 lb

## 2013-12-30 DIAGNOSIS — Z0000 Encounter for general adult medical examination without abnormal findings: Secondary | ICD-10-CM

## 2013-12-30 NOTE — Patient Instructions (Addendum)
Continue working on healthy eating and exercise as well as weight loss.   Exercise to Lose Weight Exercise and a healthy diet may help you lose weight. Your doctor may suggest specific exercises. EXERCISE IDEAS AND TIPS  Choose low-cost things you enjoy doing, such as walking, bicycling, or exercising to workout videos.  Take stairs instead of the elevator.  Walk during your lunch break.  Park your car further away from work or school.  Go to a gym or an exercise class.  Start with 5 to 10 minutes of exercise each day. Build up to 30 minutes of exercise 4 to 6 days a week.  Wear shoes with good support and comfortable clothes.  Stretch before and after working out.  Work out until you breathe harder and your heart beats faster.  Drink extra water when you exercise.  Do not do so much that you hurt yourself, feel dizzy, or get very short of breath. Exercises that burn about 150 calories:  Running 1  miles in 15 minutes.  Playing volleyball for 45 to 60 minutes.  Washing and waxing a car for 45 to 60 minutes.  Playing touch football for 45 minutes.  Walking 1  miles in 35 minutes.  Pushing a stroller 1  miles in 30 minutes.  Playing basketball for 30 minutes.  Raking leaves for 30 minutes.  Bicycling 5 miles in 30 minutes.  Walking 2 miles in 30 minutes.  Dancing for 30 minutes.  Shoveling snow for 15 minutes.  Swimming laps for 20 minutes.  Walking up stairs for 15 minutes.  Bicycling 4 miles in 15 minutes.  Gardening for 30 to 45 minutes.  Jumping rope for 15 minutes.  Washing windows or floors for 45 to 60 minutes. Document Released: 12/23/2010 Document Revised: 02/12/2012 Document Reviewed: 12/23/2010 [facility] Patient Information 2014 [place], [place].

## 2013-12-30 NOTE — Progress Notes (Signed)
The patient is here for annual wellness exam and preventative care.   Lab Results  Component Value Date   CHOL 166 12/23/2013   HDL 39.50 12/23/2013   LDLCALC 105* 12/23/2013   TRIG 106.0 12/23/2013   CHOLHDL 4 12/23/2013    OSA, well controlled now on new settings of CPAP.  He has a lot of stress going at home and work. Denies depression, no  insomnia, no anxiety. Morbid obesity.   Have been working healthy eating and regular exercise.  He is eating primarily gluten free. Walking more.Marland Kitchen Has not joined gym yet. Wt Readings from Last 3 Encounters:  12/30/13 347 lb 12 oz (157.738 kg)  10/20/13 348 lb 3.2 oz (157.942 kg)  09/05/13 349 lb 4 oz (158.419 kg)   Review of Systems  Constitutional: Negative for fever, fatigue and unexpected weight change.  HENT: Negative for ear pain, congestion, sore throat, rhinorrhea, trouble swallowing and postnasal drip.  Eyes: Negative for pain.  Respiratory: Negative for cough, shortness of breath and wheezing.  Cardiovascular: Negative for chest pain, palpitations and leg swelling.  Gastrointestinal: Negative for nausea, abdominal pain, diarrhea, constipation and blood in stool.  Genitourinary: Negative for dysuria, urgency, hematuria, discharge, penile swelling, scrotal swelling, difficulty urinating, penile pain and testicular pain.  Skin: Negative for rash.  Neurological: Negative for syncope, weakness, light-headedness, numbness and headaches.  Psychiatric/Behavioral: Negative for behavioral problems and dysphoric mood. The patient is not nervous/anxious.  Objective:   Physical Exam  Constitutional: He appears well-developed and well-nourished. Non-toxic appearance. He does not appear ill. No distress.  Morbid obesity  HENT:  Head: Normocephalic and atraumatic.  Right Ear: Hearing, tympanic membrane, external ear and ear canal normal.  Left Ear: Hearing, tympanic membrane, external ear and ear canal normal.  Nose: Nose normal.  Mouth/Throat:  Uvula is midline, oropharynx is clear and moist and mucous membranes are normal.  Eyes: Conjunctivae normal, EOM and lids are normal. Pupils are equal, round, and reactive to light. No foreign bodies found.  Neck: Trachea normal, normal range of motion and phonation normal. Neck supple. Carotid bruit is not present. No mass and no thyromegaly present.  Cardiovascular: Normal rate, regular rhythm, S1 normal, S2 normal, intact distal pulses and normal pulses. Exam reveals no gallop.  No murmur heard.  Pulmonary/Chest: Breath sounds normal. He has no wheezes. He has no rhonchi. He has no rales.  Abdominal: Soft. Normal appearance and bowel sounds are normal. There is no hepatosplenomegaly. There is no tenderness. There is no rebound, no guarding and no CVA tenderness. No hernia. Hernia confirmed negative in the right inguinal area and confirmed negative in the left inguinal area.  Genitourinary: Testes normal and penis normal. Right testis shows no mass and no tenderness. Left testis shows no mass and no tenderness. No paraphimosis or penile tenderness.  Lymphadenopathy:  He has no cervical adenopathy.  Right: No inguinal adenopathy present.  Left: No inguinal adenopathy present.  Neurological: He is alert. He has normal strength and normal reflexes. No cranial nerve deficit or sensory deficit. Gait normal.  Skin: Skin is warm, dry and intact. No rash noted.  Psychiatric: He has a normal mood and affect. His speech is normal and behavior is normal. Judgment normal.  Assessment & Plan:   The patient's preventative maintenance and recommended screening tests for an annual wellness exam were reviewed in full today.  Brought up to date unless services declined.  Counselled on the importance of diet, exercise, and its role in overall health  and mortality.  The patient's FH and SH was reviewed, including their home life, tobacco status, and drug and alcohol status.   Vaccines: Uptodate with flu and  tdap Family history of colon cancer in father at age 63, last colonscopy 07/2011... polyp. Dr. Fuller Plan Recommends repeating in 5 years (2017).  Non smoker  No family history of prostate cancer.

## 2013-12-30 NOTE — Progress Notes (Signed)
Pre-visit discussion using our clinic review tool. No additional management support is needed unless otherwise documented below in the visit note.  

## 2014-01-06 ENCOUNTER — Ambulatory Visit (INDEPENDENT_AMBULATORY_CARE_PROVIDER_SITE_OTHER): Payer: Managed Care, Other (non HMO) | Admitting: Family Medicine

## 2014-01-06 ENCOUNTER — Encounter: Payer: Self-pay | Admitting: Family Medicine

## 2014-01-06 VITALS — BP 126/78 | HR 76 | Temp 98.3°F | Wt 348.5 lb

## 2014-01-06 DIAGNOSIS — J069 Acute upper respiratory infection, unspecified: Secondary | ICD-10-CM

## 2014-01-06 DIAGNOSIS — B9789 Other viral agents as the cause of diseases classified elsewhere: Principal | ICD-10-CM

## 2014-01-06 NOTE — Assessment & Plan Note (Signed)
Anticipate viral given short duration.  ?flu like illness. Treat supportively per instructions Red flags to return discussed. Pt agrees with plan, declines cough syrup today.

## 2014-01-06 NOTE — Patient Instructions (Signed)
You have a viral upper respiratory infection, may be flu-like illness. Universal precautions - wash hands and cover mouth when coughing. Antibiotics are not needed for this.  Viral infections usually take 7-10 days to resolve.  The cough can last a few weeks to go away. Push fluids and plenty of rest. Plain mucinex or immediate release guaifenesin with plenty of water to mobilize mucous. Please return if you are not improving as expected, or if you have high fevers (>101.5) or worsening productive cough or prolonged symptoms let us know. Call clinic with questions.  Good to see you today.

## 2014-01-06 NOTE — Progress Notes (Signed)
   Subjective:    Patient ID: David Hansen, male    DOB: January 11, 1971, 43 y.o.   MRN: [id number]  HPI CC: URI  4d h/o cold sxs - started with head congestion and sinus congestion.  Low grade fever to 100 that night.  Woke up next morning with headache and body aches (now better).  Worsening PNdrainage, congestion.  Now with produtive cough of mucous and chest congestion.  Hoarse voice.  Sleeping ok at night time.  Treating with dayquil.  Also taking mucinex which isn't really helping. No ST, abd pain, nausea, ear or tooth pain, diarrhea. Out of work today. No sick contacts at home. No smokers at home. No h/o asthma. Did receive flu shot this year.  Past Medical History  Diagnosis Date  . Obesity   . Depression   . Sleep apnea      Review of Systems Per HPi    Objective:   Physical Exam  Nursing note and vitals reviewed. Constitutional: He appears well-developed and well-nourished. No distress.  HENT:  Head: Normocephalic and atraumatic.  Right Ear: Hearing, tympanic membrane, external ear and ear canal normal.  Left Ear: Hearing, tympanic membrane, external ear and ear canal normal.  Nose: Mucosal edema present. No rhinorrhea. Right sinus exhibits no maxillary sinus tenderness and no frontal sinus tenderness. Left sinus exhibits no maxillary sinus tenderness and no frontal sinus tenderness.  Mouth/Throat: Uvula is midline, oropharynx is clear and moist and mucous membranes are normal. No oropharyngeal exudate, posterior oropharyngeal edema, posterior oropharyngeal erythema or tonsillar abscesses.  Nasal congestion PNdrainage  Eyes: Conjunctivae and EOM are normal. Pupils are equal, round, and reactive to light. No scleral icterus.  Neck: Normal range of motion. Neck supple.  Cardiovascular: Normal rate, regular rhythm, normal heart sounds and intact distal pulses.   No murmur heard. Pulmonary/Chest: Effort normal and breath sounds normal. No respiratory distress. He has no  wheezes. He has no rales.  Lymphadenopathy:    He has no cervical adenopathy.  Skin: Skin is warm and dry. No rash noted.       Assessment & Plan:

## 2014-01-06 NOTE — Progress Notes (Signed)
Pre-visit discussion using our clinic review tool. No additional management support is needed unless otherwise documented below in the visit note.  

## 2014-05-22 ENCOUNTER — Ambulatory Visit (INDEPENDENT_AMBULATORY_CARE_PROVIDER_SITE_OTHER): Payer: Managed Care, Other (non HMO) | Admitting: Family Medicine

## 2014-05-22 ENCOUNTER — Encounter: Payer: Self-pay | Admitting: Family Medicine

## 2014-05-22 VITALS — BP 110/70 | HR 64 | Temp 97.8°F | Wt 343.0 lb

## 2014-05-22 DIAGNOSIS — L259 Unspecified contact dermatitis, unspecified cause: Secondary | ICD-10-CM

## 2014-05-22 MED ORDER — TRIAMCINOLONE ACETONIDE 0.5 % EX CREA
1.0000 | TOPICAL_CREAM | Freq: Two times a day (BID) | CUTANEOUS | Status: DC
Start: 2014-05-22 — End: 2018-06-12

## 2014-05-22 NOTE — Assessment & Plan Note (Addendum)
Treat with topical steroid, stop irritant polysporin, peroxide.  Stay away from chemicals at work.   For dry hands  Chronic.. Can use steroid cream as well.   No sign of cellulitis.

## 2014-05-22 NOTE — Progress Notes (Signed)
Pre visit review using our clinic review tool, if applicable. No additional management support is needed unless otherwise documented below in the visit note. 

## 2014-05-22 NOTE — Patient Instructions (Addendum)
Apply cream 2-3 times day x 2 weeks. Keep area dry. Stop polysporin and peroxide. Wash with warm soapy water. Call if not improving some in 1 week.

## 2014-05-22 NOTE — Progress Notes (Signed)
   Subjective:    Patient ID: David Hansen, male    DOB: Oct 20, 1971, 44 y.o.   MRN: [id number]  Laceration     43 year old male presents with crack/laceration for dry skin on 4th left digit. Start 2 weeks ago, first aid cream then started polysporin. Area got wet and macerated with bandaid.  Now area red and weepy in last 2 days.  No fever.  No swelling in finger, No redness spreading up finger.  Has had issues in past cracking dry skin on hands, uses hands a lot at work.      Review of Systems  Constitutional: Negative for fever and fatigue.  HENT: Negative for ear pain.   Eyes: Negative for pain.  Respiratory: Negative for cough.   Cardiovascular: Negative for chest pain.       Objective:   Physical Exam  Constitutional: He appears well-developed and well-nourished.  Neck: Normal range of motion. Neck supple.  Cardiovascular: Normal rate.   Pulmonary/Chest: Effort normal and breath sounds normal.  Skin:  erythematous patch with clear vesicles in 1 x 2 inch diameter on 4h digit, no surrounding erythema, no warmth, no pus, no odor          Assessment & Plan:

## 2014-05-24 ENCOUNTER — Emergency Department: Payer: Self-pay | Admitting: Emergency Medicine

## 2014-05-25 ENCOUNTER — Telehealth: Payer: Self-pay | Admitting: Family Medicine

## 2014-05-25 DIAGNOSIS — S82401A Unspecified fracture of shaft of right fibula, initial encounter for closed fracture: Secondary | ICD-10-CM | POA: Insufficient documentation

## 2014-05-25 NOTE — Telephone Encounter (Signed)
David Hansen is one of the best orthopedists in the state. My own father has been to see him multiple times.  Electronically Signed  By: Owens Loffler, MD On: 05/25/2014 8:22 AM

## 2014-05-25 NOTE — Telephone Encounter (Signed)
Vania Rea notified as instructed by Dr. Lorelei Pont.

## 2014-05-25 NOTE — Telephone Encounter (Signed)
Pt broke his right leg yesterday and went to [facility] to be evaluated. The ER dr says he needs to make an apptmt w/an orthopedic dr today to see if it needs to be "re-set".  They recommended Dr. Skip Estimable, Jr @ [facility].  Pt is wanting to know who our dr would recommend.  He says [facility] is not his first choice unless recommended by Dr. Diona Browner or [facility]. Please advise patient. Thank you.

## 2014-06-03 ENCOUNTER — Ambulatory Visit: Payer: Self-pay | Admitting: Podiatry

## 2014-06-03 LAB — CREATININE, SERUM
Creatinine: 1.1 mg/dL (ref 0.60–1.30)
EGFR (African American): >60
EGFR (Non-African Amer.): >60

## 2014-07-24 ENCOUNTER — Encounter (INDEPENDENT_AMBULATORY_CARE_PROVIDER_SITE_OTHER): Payer: Self-pay

## 2014-07-24 ENCOUNTER — Encounter: Payer: Self-pay | Admitting: Family Medicine

## 2014-07-24 ENCOUNTER — Ambulatory Visit (INDEPENDENT_AMBULATORY_CARE_PROVIDER_SITE_OTHER): Payer: BC Managed Care – PPO | Admitting: Family Medicine

## 2014-07-24 VITALS — BP 100/70 | HR 71 | Temp 98.3°F | Ht 71.0 in | Wt 311.5 lb

## 2014-07-24 DIAGNOSIS — L301 Dyshidrosis [pompholyx]: Secondary | ICD-10-CM

## 2014-07-24 DIAGNOSIS — L259 Unspecified contact dermatitis, unspecified cause: Secondary | ICD-10-CM

## 2014-07-24 MED ORDER — CLOBETASOL PROPIONATE 0.05 % EX CREA: 1.0000 "application " | TOPICAL_CREAM | Freq: Two times a day (BID) | CUTANEOUS | Status: DC

## 2014-07-24 NOTE — Patient Instructions (Signed)
Limit washing hands as able, use hypoallergenic, gentler soap. Hydrate skin on hands after washing with hypo allergenic moisturizing cream like cetaphil cream.  Apply clobetasol  Twice daily x 2 weeks.  Call for referral to derm if not resolving.

## 2014-07-24 NOTE — Progress Notes (Signed)
   Subjective:    Patient ID: David Hansen, male    DOB: [birth year], 43 y.o.   MRN: [id number]  Rash Pertinent negatives include no eye pain, fatigue, fever or shortness of breath.    42 year old male presents  For new onset rash on  Left 4th digit and between fingesr and on palms..  He states rash treat  In 05/2014 from chemical exposure resolved with steroid cream.  He noted new rash a few weeks ago. See bubbles under skin, they pop then rash gets dry and flaky. Very itchy. He has tried triamcinolone cream twice a day 0.5 mg for several weeks. NO benefit. He has also tried aquafore lotion for itching.  No other rash elsewhere.   Uses harsh soap at work and washes hands frequently.     Review of Systems  Constitutional: Negative for fever and fatigue.  HENT: Negative for ear pain.   Eyes: Negative for pain.  Respiratory: Negative for shortness of breath.   Cardiovascular: Negative for chest pain and leg swelling.  Skin: Positive for rash.       Objective:   Physical Exam  Constitutional: He appears well-developed.  HENT:  Head: Normocephalic.  Eyes: Conjunctivae are normal. Pupils are equal, round, and reactive to light.  Neck: Normal range of motion. Neck supple. No thyromegaly present.  Cardiovascular: Normal rate, regular rhythm and normal heart sounds.  Exam reveals no friction rub.   No murmur heard. Pulmonary/Chest: Effort normal and breath sounds normal.  Skin:  Small vesicles on plams between fingers, dry flaky skin , cracking.          Assessment & Plan:

## 2014-07-24 NOTE — Progress Notes (Signed)
Pre visit review using our clinic review tool, if applicable. No additional management support is needed unless otherwise documented below in the visit note. 

## 2014-07-24 NOTE — Assessment & Plan Note (Signed)
Likely initial trigger is recent contact derm and frequent hand washing.  Start stronger steroid cream, avoid irritants.  refer to derm if not improving.

## 2014-09-01 ENCOUNTER — Ambulatory Visit: Payer: Self-pay | Admitting: Podiatry

## 2014-10-19 ENCOUNTER — Telehealth: Payer: Self-pay | Admitting: Pulmonary Disease

## 2014-10-19 DIAGNOSIS — G4733 Obstructive sleep apnea (adult) (pediatric): Secondary | ICD-10-CM

## 2014-10-19 NOTE — Telephone Encounter (Signed)
yes

## 2014-10-19 NOTE — Telephone Encounter (Signed)
Pt states he has a nasal mask now but wants full mask (cover his nose and mouth) through [facility]. Pt last seen 10-2013 and ROV scheduled for 11-11-14. [place] are you okay with sending order to [facility] with the understanding patient keeps his OV in 11-2014? Thanks.    Pt aware we will call him once order has been taken care of. Thanks.

## 2014-10-19 NOTE — Telephone Encounter (Signed)
lmtcb x1 Order placed 

## 2014-10-20 ENCOUNTER — Ambulatory Visit: Payer: Managed Care, Other (non HMO) | Admitting: Pulmonary Disease

## 2014-10-20 NOTE — Telephone Encounter (Signed)
Pt aware that order has been placed. Nothing further needed.

## 2014-11-11 ENCOUNTER — Ambulatory Visit: Payer: BC Managed Care – PPO | Admitting: Pulmonary Disease

## 2014-11-25 ENCOUNTER — Ambulatory Visit (INDEPENDENT_AMBULATORY_CARE_PROVIDER_SITE_OTHER): Payer: BC Managed Care – PPO | Admitting: Pulmonary Disease

## 2014-11-25 ENCOUNTER — Encounter: Payer: Self-pay | Admitting: Pulmonary Disease

## 2014-11-25 VITALS — BP 114/70 | HR 65 | Temp 97.8°F | Ht 72.0 in | Wt 327.4 lb

## 2014-11-25 DIAGNOSIS — G4733 Obstructive sleep apnea (adult) (pediatric): Secondary | ICD-10-CM

## 2014-11-25 NOTE — Patient Instructions (Signed)
Continue on cpap. Will refer you to the sleep center for a mask fit session. Keep working on weight loss.  You are doing great. followup with me again in one year.

## 2014-11-25 NOTE — Progress Notes (Signed)
   Subjective:    Patient ID: David Hansen, male    DOB: 05-28-1971, 43 y.o.   MRN: [id number]  HPI The patient comes in today for follow-up of his obstructive sleep apnea. He is wearing C Pap compliantly by his download, and has excellent control of his AHI. He is having some increased mask leak, and admits that his fit is not very good.  He has lost over 20 pounds since last visit, and I have commended him on this.   Review of Systems  Constitutional: Negative for fever and unexpected weight change.  HENT: Negative for congestion, dental problem, ear pain, nosebleeds, postnasal drip, rhinorrhea, sinus pressure, sneezing, sore throat and trouble swallowing.   Eyes: Negative for redness and itching.  Respiratory: Negative for cough, chest tightness, shortness of breath and wheezing.   Cardiovascular: Negative for palpitations and leg swelling.  Gastrointestinal: Negative for nausea and vomiting.  Genitourinary: Negative for dysuria.  Musculoskeletal: Negative for joint swelling.  Skin: Negative for rash.  Neurological: Negative for headaches.  Hematological: Does not bruise/bleed easily.  Psychiatric/Behavioral: Negative for dysphoric mood. The patient is not nervous/anxious.        Objective:   Physical Exam Obese male in no acute distress Nose without purulence or discharge noted No skin breakdown or pressure necrosis from the C Pap mask Neck without lymphadenopathy or thyromegaly Lower extremities with mild edema, no cyanosis Alert and oriented, moves all 4 extremities.       Assessment & Plan:

## 2014-11-25 NOTE — Assessment & Plan Note (Signed)
The patient is wearing C Pap compliantly by his download, and continues to feel it helps his sleep and daytime alertness. He has lost over 20 pounds since last visit, and I've encouraged him to continue working on this. His only complaint is related to mask leak, and I would like to refer him to the sleep Center for a formal fitting session.

## 2014-12-16 ENCOUNTER — Ambulatory Visit (HOSPITAL_BASED_OUTPATIENT_CLINIC_OR_DEPARTMENT_OTHER): Payer: Self-pay | Attending: Pulmonary Disease | Admitting: Radiology

## 2014-12-16 DIAGNOSIS — G4733 Obstructive sleep apnea (adult) (pediatric): Secondary | ICD-10-CM

## 2014-12-16 DIAGNOSIS — Z9989 Dependence on other enabling machines and devices: Principal | ICD-10-CM

## 2015-03-27 NOTE — Op Note (Signed)
PATIENT NAME:  David Hansen, David Hansen MR#:  [id number] DATE OF BIRTH:  Aug 12, 1971  DATE OF PROCEDURE:  06/03/2014  DATE OF PROCEDURE: 06/03/2014.   PREOPERATIVE DIAGNOSIS: Right ankle bimalleolar equivalent fracture. Right syndesmotic rupture  POSTOPERATIVE DIAGNOSIS: Right ankle bimalleolar equivalent fracture. Right syndesmotic rupture  PROCEDURE: Open reduction with internal fixation bimalleolar equivalent fracture. Open repair of syndesmosis right ankle with internal fixation  SURGEON: David Hansen, a spinal with local block.   ANESTHESIA:  Spinal with local block.   HEMOSTASIS: Thigh tourniquet inflated to 325 mmHg for approximately 110 minutes.   COMPLICATIONS: None.   SPECIMEN: None.   OPERATIVE INDICATIONS: This is a 44 year old gentleman who presents today with a bimalleolar equivalent fracture. He had noted displacement of the ankle joint mortise with a syndesmotic injury. There was noted widening of the medial gutter space. We discussed conservative versus surgical intervention and surgery has been decided upon. All risks, benefits, alternatives, and complications associated with surgery were discussed with the patient. Informed consent has been given.   OPERATIVE PROCEDURE: The patient was brought into the OR and placed on operating table in supine position. IV sedation was administered a spinal with narcotics. The right lower extremity was then prepped and draped in the usual sterile fashion. Attention was directed to the lateral aspect of the fibula where a longitudinal incision was made from the distal tip of the fibula to the proximal one-half of the lower leg. Sharp and blunt dissection carried down to the periosteum. Subperiosteal dissection was then undertaken.   There was noted to be a fragmented proximal oblique fibular fracture with a butterfly fragment both anterior and posterior with proximal displacement of the main distal fragment. With use of bone reduction  forceps I was able to reduce fracture fragments into an anatomic position.   At this time a [employer] composite fibular plate was placed overlying the distal one-half of the fibula. A proximal locking screw and a distal locking screw was placed into the fibular site. Good stability was noted. At this time proximal and distal screws were filled with 3.5-mm locking and nonlocking screws as appropriate. Good stability was noted. The anterior butterfly fragment was large enough I was able to place a 2.7-mm lag screw, into this. The posterior butterfly fragment was too small for screw fixation and I was able to suture this down to the posterior aspect of the fibula.   At this time, a compression forceps was placed to the tibia and fibula region. Just proximal to the ankle joint mortise through the fibular plate I was able to place a ZipTight ankle syndesmosis fixation system from [employer]. This was a titanium ZipTight. This was sutured down and tightened up appropriately. Good stability was noted during live action of the ankle joint mortise.   The wound was then flushed with copious amounts of irrigation and layered closure was performed with 2-0 and 3-0 Vicryl as well as skin staples for the skin. A 0.25% Marcaine block was placed around the ankle joint itself. The patient tolerated the procedure and anesthesia well and was transported from the operating room to the PACU with all vital signs stable and neurovascular status intact. I will admit him for a 23-hour short stay for pain management. He had spinal with narcotics and anesthesia will manage his pain medication. I will see him tomorrow for discharge. He will remain non-weight-bearing between now and the next 6 weeks.   ____________________________ David Hansen, DPM jaf:lt D: 06/03/2014 10:93:23 ET T: 06/04/2014  00:92:33 ET JOB#: [id number]  cc: David Hansen, DPM, <Dictator> David Hansen DPM ELECTRONICALLY SIGNED 06/09/2014 8:41

## 2015-04-30 ENCOUNTER — Ambulatory Visit (INDEPENDENT_AMBULATORY_CARE_PROVIDER_SITE_OTHER): Payer: BLUE CROSS/BLUE SHIELD | Admitting: Primary Care

## 2015-04-30 ENCOUNTER — Encounter: Payer: Self-pay | Admitting: Primary Care

## 2015-04-30 NOTE — Patient Instructions (Signed)
It is important that you improve your diet. Please limit carbohydrates in the form of white bread, rice, pasta, cakes, cookies, sugary drinks, etc. Increase your consumption of fresh fruits and vegetables. Be sure to drink plenty of water daily.  Your form has been completed for [employer]. It was nice meeting you!

## 2015-04-30 NOTE — Progress Notes (Signed)
Subjective:    Patient ID: David Hansen, male    DOB: 01/27/1971, 44 y.o.   MRN: [id number]  HPI  David Hansen is a 43 year old male who presents today for completion of form for [place].  1) Obesity: Endorses a low carb, high protein diet. Drinks mostly water, no sodas, no sweet tea. He stays active but does not exercise daily. Denies consumption of fast food, and when eating out will typically eat salads.  Wt Readings from Last 3 Encounters:  04/30/15 328 lb 6.4 oz (148.961 kg)  11/25/14 327 lb 6.4 oz (148.508 kg)  07/24/14 311 lb 8 oz (141.295 kg)     Review of Systems  Constitutional: Negative for fatigue and unexpected weight change.  HENT: Negative for rhinorrhea.   Respiratory: Negative for cough and shortness of breath.   Cardiovascular: Negative for chest pain.  Gastrointestinal: Negative for diarrhea, constipation and blood in stool.  Genitourinary: Negative for dysuria, frequency and difficulty urinating.  Musculoskeletal: Negative for myalgias and arthralgias.  Skin: Negative for rash.  Allergic/Immunologic: Negative for environmental allergies.  Neurological: Negative for dizziness, numbness and headaches.  Hematological: Negative for adenopathy.  Psychiatric/Behavioral:       Denies concerns for anxiety or depression       Past Medical History  Diagnosis Date  . Obesity   . Depression   . Sleep apnea     History   Social History  . Marital Status: Married    Spouse Name: N/A  . Number of Children: 2  . Years of Education: N/A   Occupational History  . Land    Social History Main Topics  . Smoking status: Never Smoker   . Smokeless tobacco: Never Used  . Alcohol Use: Yes     Comment: rarely  . Drug Use: No  . Sexual Activity: Not on file   Other Topics Concern  . Not on file   Social History Narrative   Regular exercise yes 2-3 times a week...walking      Diet: fruits and veggies,lean protein, water    Past  Surgical History  Procedure Laterality Date  . Knee arthroscopy w/ acl reconstruction and patella graft Right 1998    [facility] in [place]  Right knee    Family History  Problem Relation Age of Onset  . Cancer Father 20    colon cancer  . Colon cancer Father     No Known Allergies  Current Outpatient Prescriptions on File Prior to Visit  Medication Sig Dispense Refill  . triamcinolone cream (KENALOG) 0.5 % Apply 1 application topically 2 (two) times daily. 30 g 0   No current facility-administered medications on file prior to visit.    BP 122/70 mmHg  Pulse 69  Temp(Src) 98.3 F (36.8 C) (Oral)  Ht 6' (1.829 m)  Wt 328 lb 6.4 oz (148.961 kg)  BMI 44.53 kg/m2  SpO2 97%    Objective:   Physical Exam  Constitutional: He is oriented to person, place, and time. He appears well-nourished.  HENT:  Right Ear: Tympanic membrane and ear canal normal.  Left Ear: Tympanic membrane and ear canal normal.  Nose: Nose normal.  Mouth/Throat: Oropharynx is clear and moist.  Eyes: Conjunctivae and EOM are normal. Pupils are equal, round, and reactive to light.  Neck: Neck supple. No thyromegaly present.  Cardiovascular: Normal rate and regular rhythm.   Pulmonary/Chest: Effort normal and breath sounds normal.  Abdominal: Soft. Bowel sounds are normal. He  exhibits no mass. There is no tenderness.  Lymphadenopathy:    He has no cervical adenopathy.  Neurological: He is alert and oriented to person, place, and time. He has normal reflexes. No cranial nerve deficit.  Skin: Skin is warm and dry.  Psychiatric: He has a normal mood and affect.          Assessment & Plan:  Encounter for form completion:  Form completed.  Exam unremarkable. Discussed importance of daily exercise and healthy diet. 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

## 2015-04-30 NOTE — Assessment & Plan Note (Signed)
Weight gain of 1 pound since last visit. Endorses healthy diet. Does not exercise. Discussed importance of both.

## 2015-09-23 ENCOUNTER — Encounter: Payer: Self-pay | Admitting: Pulmonary Disease

## 2015-11-11 ENCOUNTER — Ambulatory Visit (INDEPENDENT_AMBULATORY_CARE_PROVIDER_SITE_OTHER): Payer: BLUE CROSS/BLUE SHIELD | Admitting: Emergency Medicine

## 2015-11-11 ENCOUNTER — Encounter: Payer: Self-pay | Admitting: Emergency Medicine

## 2015-11-11 VITALS — BP 138/88 | HR 69 | Ht 72.0 in | Wt 343.4 lb

## 2015-11-11 DIAGNOSIS — G4733 Obstructive sleep apnea (adult) (pediatric): Secondary | ICD-10-CM | POA: Diagnosis not present

## 2015-11-11 NOTE — Progress Notes (Signed)
   Subjective:    Patient ID: David Hansen, male    DOB: 27-Dec-1970, 44 y.o.   MRN: [id number]  HPI 44 yo man with hx of obesity, sleep apnea dx 10 yrs ago followed in our office by Dr Gwenette Greet. He returns today reporting that he is having more am sleepiness for the last 2 months. He has tried different masks, wonders if he is having leak. He tried full face mask after he noted that he is opening his mouth, has had a fitting. Tried chinstrap at one point. His download shows good control of his OSA on 11 cm H2O, great complaince. His sleep duration hasn';t changed. No medication changes, no change in job.  He has a ResMed with a full face mask, has also used nasal mask in the past. Tried nasal pillows once but didn't work well.    Review of Systems As per HPI  Past Medical History  Diagnosis Date  . Obesity   . Depression   . Sleep apnea      Family History  Problem Relation Age of Onset  . Cancer Father 46    colon cancer  . Colon cancer Father      Social History   Social History  . Marital Status: Married    Spouse Name: N/A  . Number of Children: 2  . Years of Education: N/A   Occupational History  . Land    Social History Main Topics  . Smoking status: Never Smoker   . Smokeless tobacco: Never Used  . Alcohol Use: Yes     Comment: rarely  . Drug Use: No  . Sexual Activity: Not on file   Other Topics Concern  . Not on file   Social History Narrative   Regular exercise yes 2-3 times a week...walking      Diet: fruits and veggies,lean protein, water     No Known Allergies   Outpatient Prescriptions Prior to Visit  Medication Sig Dispense Refill  . triamcinolone cream (KENALOG) 0.5 % Apply 1 application topically 2 (two) times daily. 30 g 0   No facility-administered medications prior to visit.         Objective:   Physical Exam Filed Vitals:   11/11/15 1554  BP: 138/88  Pulse: 69  Height: 6' (1.829 m)  Weight: 343 lb 6.4 oz  (155.765 kg)  SpO2: 97%   Gen: Pleasant, overwt man, in no distress,  normal affect  ENT: No lesions,  mouth clear,  oropharynx clear, no postnasal drip  Neck: No JVD, no stridor  Lungs: No use of accessory muscles, clear without rales or rhonchi  Cardiovascular: RRR, heart sounds normal, no murmur or gallops, no peripheral edema  Musculoskeletal: No deformities, no cyanosis or clubbing  Neuro: alert, non focal  Skin: Warm, no lesions or rashes      Assessment & Plan:  Obstructive sleep apnea Great compliance with his CPAP and low AHI but he is still sleepy in the am. He notes that his system leaks - both mask and out his mouth when it opens on nasal mask. He is now on a full face mask but still leaking. I suspect that this is the leading cause of his decline. We will work on getting a better mask fit. He wants to try a new company to work with, will refer him to [facility]

## 2015-11-11 NOTE — Assessment & Plan Note (Signed)
Great compliance with his CPAP and low AHI but he is still sleepy in the am. He notes that his system leaks - both mask and out his mouth when it opens on nasal mask. He is now on a full face mask but still leaking. I suspect that this is the leading cause of his decline. We will work on getting a better mask fit. He wants to try a new company to work with, will refer him to [facility]

## 2015-11-11 NOTE — Patient Instructions (Signed)
We will refer you to [facility] to work on getting tubing, equipment, mask for your CPAP. We will ask for an RT evaluation to work on best fit for mask.  Continue your CPAP every night set at 11 cmH2O.  Follow with Dr Lamonte Sakai in 12,  months or sooner if you have any problems

## 2015-11-19 ENCOUNTER — Ambulatory Visit: Payer: BC Managed Care – PPO | Admitting: Pulmonary Disease

## 2016-04-17 ENCOUNTER — Encounter: Payer: Self-pay | Admitting: Medical

## 2016-04-17 ENCOUNTER — Ambulatory Visit (INDEPENDENT_AMBULATORY_CARE_PROVIDER_SITE_OTHER): Payer: BLUE CROSS/BLUE SHIELD | Admitting: Medical

## 2016-04-17 VITALS — BP 124/80 | HR 60 | Temp 98.2°F | Ht 72.0 in | Wt 347.0 lb

## 2016-04-17 DIAGNOSIS — J309 Allergic rhinitis, unspecified: Secondary | ICD-10-CM | POA: Diagnosis not present

## 2016-04-17 DIAGNOSIS — J0100 Acute maxillary sinusitis, unspecified: Secondary | ICD-10-CM

## 2016-04-17 DIAGNOSIS — M7041 Prepatellar bursitis, right knee: Secondary | ICD-10-CM

## 2016-04-17 MED ORDER — BENZONATATE 100 MG PO CAPS: 100.0000 mg | ORAL_CAPSULE | Freq: Three times a day (TID) | ORAL | Status: DC | PRN

## 2016-04-17 MED ORDER — AZITHROMYCIN 250 MG PO TABS: ORAL_TABLET | ORAL | Status: DC

## 2016-04-17 MED ORDER — FLUTICASONE PROPIONATE 50 MCG/ACT NA SUSP: 2.0000 | Freq: Every day | NASAL | Status: DC

## 2016-04-17 NOTE — Progress Notes (Signed)
Pre visit review using our clinic review tool, if applicable. No additional management support is needed unless otherwise documented below in the visit note. 

## 2016-04-17 NOTE — Progress Notes (Signed)
Subjective:    Patient ID: David Hansen, male    DOB: [birth year], 45 y.o.   MRN: [id number]  HPI   Pt in feeling sick since [place].   At first mild scratchy throat. Pt now feeling nasal and chest congested.   At first thought maybe allergy. He was sneezing and pnd.  Some sinus pressure now. Blows nose and colored mucous.(progressivley worse over the weekend)  Also rt knee pain. Pain on and off for one year. Pt states he fracture his ankle in pat. Pt states for a while he was on scooter with knee rested on pad. Swells up intermittenlty with more acitivity playing golf. Pt has remote injury 1998-2000 resultsed in acl repair. Occasional works on his knee.      Review of Systems  Constitutional: Positive for fever. Negative for chills and fatigue.  HENT: Positive for postnasal drip, sinus pressure and sneezing.   Respiratory: Positive for cough. Negative for chest tightness.   Cardiovascular: Negative for chest pain and palpitations.  Gastrointestinal: Negative for abdominal pain.  Musculoskeletal:       Knee swelling.  Skin: Negative for rash.  Neurological: Negative for dizziness and headaches.  Hematological: Negative for adenopathy. Does not bruise/bleed easily.  Psychiatric/Behavioral: Negative for behavioral problems and confusion.     Past Medical History  Diagnosis Date  . Obesity   . Depression   . Sleep apnea      Social History   Social History  . Marital Status: Married    Spouse Name: N/A  . Number of Children: 2  . Years of Education: N/A   Occupational History  . Land    Social History Main Topics  . Smoking status: Never Smoker   . Smokeless tobacco: Never Used  . Alcohol Use: Yes     Comment: rarely  . Drug Use: No  . Sexual Activity: Not on file   Other Topics Concern  . Not on file   Social History Narrative   Regular exercise yes 2-3 times a week...walking      Diet: fruits and veggies,lean protein, water     Past Surgical History  Procedure Laterality Date  . Knee arthroscopy w/ acl reconstruction and patella graft Right 1998    [facility] in [place]  Right knee    Family History  Problem Relation Age of Onset  . Cancer Father 95    colon cancer  . Colon cancer Father     No Known Allergies  Current Outpatient Prescriptions on File Prior to Visit  Medication Sig Dispense Refill  . triamcinolone cream (KENALOG) 0.5 % Apply 1 application topically 2 (two) times daily. 30 g 0   No current facility-administered medications on file prior to visit.    BP 124/80 mmHg  Pulse 60  Temp(Src) 98.2 F (36.8 C) (Oral)  Ht 6' (1.829 m)  Wt 347 lb (157.398 kg)  BMI 47.05 kg/m2  SpO2 98%       Objective:   Physical Exam  General  Mental Status - Alert. General Appearance - Well groomed. Not in acute distress.  Skin Rashes- No Rashes.  HEENT Head- Normal. Ear Auditory Canal - Left- Normal. Right - Normal.Tympanic Membrane- Left- Normal. Right- Normal. Eye Sclera/Conjunctiva- Left- Normal. Right- Normal. Nose & Sinuses Nasal Mucosa- Left-  Boggy and Congested. Right-  Boggy and  Congested.Bilateral maxillary and frontal sinus pressure. Mouth & Throat Lips: Upper Lip- Normal: no dryness, cracking, pallor, cyanosis, or vesicular eruption. Lower Lip-Normal: no dryness,  cracking, pallor, cyanosis or vesicular eruption. Buccal Mucosa- Bilateral- No Aphthous ulcers. Oropharynx- No Discharge or Erythema. Tonsils: Characteristics- Bilateral- No Erythema or Congestion. Size/Enlargement- Bilateral- No enlargement. Discharge- bilateral-None.  Neck Neck- Supple. No Masses.   Chest and Lung Exam Auscultation: Breath Sounds:-Clear even and unlabored.  Cardiovascular Auscultation:Rythm- Regular, rate and rhythm. Murmurs & Other Heart Sounds:Ausculatation of the heart reveal- No Murmurs.  Lymphatic Head & Neck General Head & Neck Lymphatics: Bilateral: Description- No  Localized lymphadenopathy.  RT knee- no instability. Slight swelling over patella. No crepitus or warmth.       Assessment & Plan:  For nasal congestion and allergies rx flonase.  For cough rx benzonatate.  For sinus infection and possible bronchitis rx azithromycin  Conservative measures for prepatellar bursitis. Went ahead and referred to sports medicine due to chronicity.  Follow up in 7 days or as needed   Melyna Huron, Percell Miller, [employer]

## 2016-04-17 NOTE — Patient Instructions (Addendum)
For nasal congestion and allergies rx flonase.  For cough rx benzonatate.  For sinus infection and possible bronchitis rx azithromycin  Conservative measures for prepatellar bursitis.  Went ahead and referred to sports medicine due to chronicity.  Follow up in 7 days or as needed

## 2016-04-24 ENCOUNTER — Encounter: Payer: Self-pay | Admitting: Family Medicine

## 2016-04-24 ENCOUNTER — Ambulatory Visit (INDEPENDENT_AMBULATORY_CARE_PROVIDER_SITE_OTHER): Payer: BLUE CROSS/BLUE SHIELD | Admitting: Family Medicine

## 2016-04-24 VITALS — BP 106/73 | HR 61 | Ht 72.0 in | Wt 346.0 lb

## 2016-04-24 DIAGNOSIS — M25561 Pain in right knee: Secondary | ICD-10-CM

## 2016-04-24 DIAGNOSIS — M7041 Prepatellar bursitis, right knee: Secondary | ICD-10-CM

## 2016-04-24 MED ORDER — METHYLPREDNISOLONE ACETATE 40 MG/ML IJ SUSP
40.0000 mg | Freq: Once | INTRAMUSCULAR | Status: AC
  Administered 2016-04-24: 20 mg via INTRA_ARTICULAR

## 2016-04-24 NOTE — Patient Instructions (Signed)
You have prepatellar bursitis of your right knee. Ice the area 15 minutes at a time 3-4 times a day. Ibuprofen 600mg  three times a day OR aleve 2 tabs twice a day with food as needed for pain and inflammation. You were given a cortisone injection today. Use ACE wrap or compression sleeve as much as possible next 2 weeks. Elevate when possible especially when sleeping. Do hamstring exercises (curl and swing) 3 sets of 10 once a day on left. I'd expect 2-4 weeks this left knee should feel completely better. Follow up with me in 1 month for reevaluation.

## 2016-04-25 ENCOUNTER — Encounter: Payer: Self-pay | Admitting: Gastroenterology

## 2016-04-25 DIAGNOSIS — M7041 Prepatellar bursitis, right knee: Secondary | ICD-10-CM | POA: Insufficient documentation

## 2016-04-25 NOTE — Progress Notes (Signed)
PCP: Eliezer Lofts, MD Consultation requested by: Mackie Pai [facility]   Subjective:   HPI: Patient is a 45 y.o. male here for right knee pain.  Patient reports he's had right knee pain for about a year. Remote ACL tear with reconstruction in the late 1990's but done well since then. Believes current issue started following right ankle surgery in June 2016 - used a knee scooter and noticed localized swelling over kneecap right side. Pain is at worst 2/10, uncomfortable soreness. Worse with kneeling on this. Has tried icing, elevation, advil. No skin changes, numbness.  Past Medical History  Diagnosis Date  . Obesity   . Depression   . Sleep apnea     Current Outpatient Prescriptions on File Prior to Visit  Medication Sig Dispense Refill  . azithromycin (ZITHROMAX) 250 MG tablet Take 2 tablets by mouth on day 1, followed by 1 tablet by mouth daily for 4 days. 6 tablet 0  . benzonatate (TESSALON) 100 MG capsule Take 1 capsule (100 mg total) by mouth 3 (three) times daily as needed for cough. 21 capsule 0  . fluticasone (FLONASE) 50 MCG/ACT nasal spray Place 2 sprays into both nostrils daily. 16 g 0  . triamcinolone cream (KENALOG) 0.5 % Apply 1 application topically 2 (two) times daily. 30 g 0   No current facility-administered medications on file prior to visit.    Past Surgical History  Procedure Laterality Date  . Knee arthroscopy w/ acl reconstruction and patella graft Right 1998    [facility] in [place]  Right knee    No Known Allergies  Social History   Social History  . Marital Status: Married    Spouse Name: N/A  . Number of Children: 2  . Years of Education: N/A   Occupational History  . Land    Social History Main Topics  . Smoking status: Never Smoker   . Smokeless tobacco: Never Used  . Alcohol Use: 0.0 oz/week    0 Standard drinks or equivalent per week     Comment: rarely  . Drug Use: No  . Sexual Activity: Not on file   Other  Topics Concern  . Not on file   Social History Narrative   Regular exercise yes 2-3 times a week...walking      Diet: fruits and veggies,lean protein, water    Family History  Problem Relation Age of Onset  . Cancer Father 34    colon cancer  . Colon cancer Father     BP 106/73 mmHg  Pulse 61  Ht 6' (1.829 m)  Wt 346 lb (156.945 kg)  BMI 46.92 kg/m2  Review of Systems: See HPI above.    Objective:  Physical Exam:  Gen: NAD, comfortable in exam room  Right knee: Localized swelling prepatellar bursa, mild.  No bruising, other deformity.  No redness or warmth. Minimal tenderness prepatellar bursa. FROM. Negative ant/post drawers. Negative valgus/varus testing. Negative lachmanns. Negative mcmurrays, apleys, patellar apprehension. NV intact distally.  Left knee: FROM without pain.    Assessment & Plan:  1. Right knee prepatellar bursitis - no evidence infection.  Confirmed by ultrasound.  Discussed options - went ahead with localized injection.  No enough fluid to aspirate.  Icing, nsaids, compression.  Elevate as needed.  F/u in 1 month.  After informed written consent patient was seated on exam table.  Area overlying right prepatellar bursa prepped with alcohol swab then injected - aspirate of bursal fluid confirmed needle placement then bursa injected with  0.5:0.81mL marcaine: depomedrol.  Patient tolerated procedure well without immediate complications.

## 2016-04-25 NOTE — Assessment & Plan Note (Signed)
no evidence infection.  Confirmed by ultrasound.  Discussed options - went ahead with localized injection.  No enough fluid to aspirate.  Icing, nsaids, compression.  Elevate as needed.  F/u in 1 month.  After informed written consent patient was seated on exam table.  Area overlying right prepatellar bursa prepped with alcohol swab then injected - aspirate of bursal fluid confirmed needle placement then bursa injected with 0.5:0.4mL marcaine: depomedrol.  Patient tolerated procedure well without immediate complications.

## 2016-05-11 ENCOUNTER — Encounter: Payer: Self-pay | Admitting: Family Medicine

## 2016-05-11 ENCOUNTER — Ambulatory Visit (INDEPENDENT_AMBULATORY_CARE_PROVIDER_SITE_OTHER): Payer: BLUE CROSS/BLUE SHIELD | Admitting: Family Medicine

## 2016-05-11 VITALS — BP 109/74 | HR 59 | Ht 72.0 in | Wt 340.0 lb

## 2016-05-11 DIAGNOSIS — M25562 Pain in left knee: Secondary | ICD-10-CM

## 2016-05-11 DIAGNOSIS — M7041 Prepatellar bursitis, right knee: Secondary | ICD-10-CM

## 2016-05-11 NOTE — Patient Instructions (Signed)
Your left knee pain is consistent with degenerative meniscus tear though arthritis flare-ups can present similarly. These are treated the same initially and new data shows surgery is equivalent to physical therapy/conservative treatment. Ibuprofen 600mg  three times a day with food OR Aleve 1-2 tabs twice a day with food for pain, swelling, inflammation. Capsaicin, aspercreme, or biofreeze topically up to four times a day may also help with pain. Cortisone injections are an option. It's important that you continue to stay active. Add straight leg raises, knee extensions, half-squats and half-lunges 3 sets of 10 once a day (add ankle weight if these become too easy). Consider physical therapy to strengthen muscles around the joint that hurts to take pressure off of the joint itself. Shoe inserts with good arch support may be helpful. Heat or ice 15 minutes at a time 3-4 times a day as needed to help with pain. Continue with knee sleeve. Call me if you want to do injection, therapy, or MRI. Follow up with me in 1 month to 6 weeks.

## 2016-05-15 DIAGNOSIS — M25562 Pain in left knee: Secondary | ICD-10-CM | POA: Insufficient documentation

## 2016-05-15 NOTE — Assessment & Plan Note (Signed)
no evidence infection.  Improving with injection.  Icing, nsaids, compression.

## 2016-05-15 NOTE — Progress Notes (Signed)
PCP: Eliezer Lofts, MD Consultation requested by: Mackie Pai [facility]   Subjective:   HPI: Patient is a 45 y.o. male here for right knee pain.  5/22: Patient reports he's had right knee pain for about a year. Remote ACL tear with reconstruction in the late 1990's but done well since then. Believes current issue started following right ankle surgery in June 2016 - used a knee scooter and noticed localized swelling over kneecap right side. Pain is at worst 2/10, uncomfortable soreness. Worse with kneeling on this. Has tried icing, elevation, advil. No skin changes, numbness.  6/8: Patient returns with bilateral knee pain. He states left knee pain is 3/10, right 1/10, dull. Difficulty extending knees. Tried ibuprofen, icing, sleeve. Some swelling in both knees. Right has improved since localized injection - bursa doesn't hurt as much. No skin changes, numbness.  Past Medical History  Diagnosis Date  . Obesity   . Depression   . Sleep apnea     Current Outpatient Prescriptions on File Prior to Visit  Medication Sig Dispense Refill  . azithromycin (ZITHROMAX) 250 MG tablet Take 2 tablets by mouth on day 1, followed by 1 tablet by mouth daily for 4 days. 6 tablet 0  . benzonatate (TESSALON) 100 MG capsule Take 1 capsule (100 mg total) by mouth 3 (three) times daily as needed for cough. 21 capsule 0  . fluticasone (FLONASE) 50 MCG/ACT nasal spray Place 2 sprays into both nostrils daily. 16 g 0  . triamcinolone cream (KENALOG) 0.5 % Apply 1 application topically 2 (two) times daily. 30 g 0   No current facility-administered medications on file prior to visit.    Past Surgical History  Procedure Laterality Date  . Knee arthroscopy w/ acl reconstruction and patella graft Right 1998    [facility] in [place]  Right knee    No Known Allergies  Social History   Social History  . Marital Status: Married    Spouse Name: N/A  . Number of Children: 2  . Years of Education:  N/A   Occupational History  . Land    Social History Main Topics  . Smoking status: Never Smoker   . Smokeless tobacco: Never Used  . Alcohol Use: 0.0 oz/week    0 Standard drinks or equivalent per week     Comment: rarely  . Drug Use: No  . Sexual Activity: Not on file   Other Topics Concern  . Not on file   Social History Narrative   Regular exercise yes 2-3 times a week...walking      Diet: fruits and veggies,lean protein, water    Family History  Problem Relation Age of Onset  . Cancer Father 52    colon cancer  . Colon cancer Father     BP 109/74 mmHg  Pulse 59  Ht 6' (1.829 m)  Wt 340 lb (154.223 kg)  BMI 46.10 kg/m2  Review of Systems: See HPI above.    Objective:  Physical Exam:  Gen: NAD, comfortable in exam room  Right knee: Localized swelling prepatellar bursa, mild.  No bruising, other deformity.  No redness or warmth. No tenderness prepatellar bursa. No joint line tenderness. FROM. Negative ant/post drawers. Negative valgus/varus testing. Negative lachmanns. Negative mcmurrays, apleys, patellar apprehension. NV intact distally.  Left knee: No gross deformity, ecchymoses, effusion. TTP medial joint line.  No other tenderness. FROM. Negative ant/post drawers. Negative valgus/varus testing. Negative lachmanns. Mild pain mcmurrays, apleys, negative patellar apprehension. NV intact distally.  Assessment & Plan:  1. Right knee prepatellar bursitis - no evidence infection.  Improving with injection.  Icing, nsaids, compression.  2. Left knee pain - concerning for degenerative medial meniscus tear.  Shown home exercises to do daily.  Declined PT for now.  Discussed nsaids, topical medications, consider cortisone injection in future.  Knee sleeve.  F/u in 1 month to 6 weeks.

## 2016-05-15 NOTE — Assessment & Plan Note (Signed)
concerning for degenerative medial meniscus tear.  Shown home exercises to do daily.  Declined PT for now.  Discussed nsaids, topical medications, consider cortisone injection in future.  Knee sleeve.  F/u in 1 month to 6 weeks.

## 2016-05-29 ENCOUNTER — Ambulatory Visit: Payer: BLUE CROSS/BLUE SHIELD | Admitting: Family Medicine

## 2016-06-01 ENCOUNTER — Ambulatory Visit (INDEPENDENT_AMBULATORY_CARE_PROVIDER_SITE_OTHER): Payer: BLUE CROSS/BLUE SHIELD | Admitting: Family Medicine

## 2016-06-01 ENCOUNTER — Encounter: Payer: Self-pay | Admitting: Family Medicine

## 2016-06-01 ENCOUNTER — Ambulatory Visit: Payer: BLUE CROSS/BLUE SHIELD | Admitting: Family Medicine

## 2016-06-01 VITALS — BP 113/73 | HR 66 | Ht 72.0 in | Wt 340.0 lb

## 2016-06-01 DIAGNOSIS — M7041 Prepatellar bursitis, right knee: Secondary | ICD-10-CM | POA: Diagnosis not present

## 2016-06-01 DIAGNOSIS — M25562 Pain in left knee: Secondary | ICD-10-CM | POA: Diagnosis not present

## 2016-06-01 NOTE — Patient Instructions (Addendum)
We will go ahead with an MRI of your left knee to assess for a meniscus tear. You may need to get updated x-rays before this can be approved.

## 2016-06-02 NOTE — Assessment & Plan Note (Signed)
concerning for degenerative medial meniscus tear with positive testing.  Has completed about 6 weeks conservative treatment, home exercises, done regular ibuprofen without much benefit.  We discussed injection, physical therapy, MRI - he would like to go ahead with MRI to further assess.  Continue with knee sleeve also.

## 2016-06-02 NOTE — Assessment & Plan Note (Signed)
no evidence infection.  Improved.

## 2016-06-02 NOTE — Progress Notes (Addendum)
PCP: Eliezer Lofts, MD Consultation requested by: Mackie Pai [facility]   Subjective:   HPI: Patient is a 45 y.o. male here for right knee pain.  5/22: Patient reports he's had right knee pain for about a year. Remote ACL tear with reconstruction in the late 1990's but done well since then. Believes current issue started following right ankle surgery in June 2016 - used a knee scooter and noticed localized swelling over kneecap right side. Pain is at worst 2/10, uncomfortable soreness. Worse with kneeling on this. Has tried icing, elevation, advil. No skin changes, numbness.  6/8: Patient returns with bilateral knee pain. He states left knee pain is 3/10, right 1/10, dull. Difficulty extending knees. Tried ibuprofen, icing, sleeve. Some swelling in both knees. Right has improved since localized injection - bursa doesn't hurt as much. No skin changes, numbness.  6/29: Patient returns with right knee improved, left knee is worsening. Pain anterior, medial. Sharp at 5/10 level. Has tried bracing, icing, advil without much benefit. Doing home exercise program as well. No skin changes, numbness. Worse with twisting.  Past Medical History  Diagnosis Date  . Obesity   . Depression   . Sleep apnea     Current Outpatient Prescriptions on File Prior to Visit  Medication Sig Dispense Refill  . azithromycin (ZITHROMAX) 250 MG tablet Take 2 tablets by mouth on day 1, followed by 1 tablet by mouth daily for 4 days. 6 tablet 0  . benzonatate (TESSALON) 100 MG capsule Take 1 capsule (100 mg total) by mouth 3 (three) times daily as needed for cough. 21 capsule 0  . fluticasone (FLONASE) 50 MCG/ACT nasal spray Place 2 sprays into both nostrils daily. 16 g 0  . triamcinolone cream (KENALOG) 0.5 % Apply 1 application topically 2 (two) times daily. 30 g 0   No current facility-administered medications on file prior to visit.    Past Surgical History  Procedure Laterality Date  . Knee  arthroscopy w/ acl reconstruction and patella graft Right 1998    [facility] in [place]  Right knee    No Known Allergies  Social History   Social History  . Marital Status: Married    Spouse Name: N/A  . Number of Children: 2  . Years of Education: N/A   Occupational History  . Land    Social History Main Topics  . Smoking status: Never Smoker   . Smokeless tobacco: Never Used  . Alcohol Use: 0.0 oz/week    0 Standard drinks or equivalent per week     Comment: rarely  . Drug Use: No  . Sexual Activity: Not on file   Other Topics Concern  . Not on file   Social History Narrative   Regular exercise yes 2-3 times a week...walking      Diet: fruits and veggies,lean protein, water    Family History  Problem Relation Age of Onset  . Cancer Father 63    colon cancer  . Colon cancer Father     BP 113/73 mmHg  Pulse 66  Ht 6' (1.829 m)  Wt 340 lb (154.223 kg)  BMI 46.10 kg/m2  Review of Systems: See HPI above.    Objective:  Physical Exam:  Gen: NAD, comfortable in exam room  Right knee: Localized swelling prepatellar bursa, mild.  No bruising, other deformity.  No redness or warmth. No tenderness prepatellar bursa. No joint line tenderness. FROM. Negative ant/post drawers. Negative valgus/varus testing. Negative lachmanns. Negative mcmurrays, apleys, patellar apprehension. NV  intact distally.  Left knee: No gross deformity, ecchymoses, effusion. TTP medial joint line.  No other tenderness. FROM. Negative ant/post drawers. Negative valgus/varus testing. Negative lachmanns. Positive mcmurrays, apleys, negative patellar apprehension. NV intact distally.    Assessment & Plan:  1. Right knee prepatellar bursitis - no evidence infection.  Improved.  2. Left knee pain - concerning for degenerative medial meniscus tear with positive testing.  Has completed about 6 weeks conservative treatment, home exercises, done regular ibuprofen  without much benefit.  We discussed injection, physical therapy, MRI - he would like to go ahead with MRI to further assess.  Continue with knee sleeve also.  Addendum:  MRI reviewed and discussed with patient.  He has a flap tear of medial meniscus.  We discussed options - he will continue with home exercises for at least a couple more weeks - if not improving will consider injection and/or PT.  Surgery as last option or if knee locks.

## 2016-06-07 NOTE — Addendum Note (Signed)
Addended by: Sherrie George F on: 06/07/2016 08:48 AM   Modules accepted: Orders

## 2016-06-09 ENCOUNTER — Ambulatory Visit: Payer: BLUE CROSS/BLUE SHIELD | Admitting: Family Medicine

## 2016-06-15 ENCOUNTER — Encounter: Payer: Self-pay | Admitting: Family Medicine

## 2016-12-27 ENCOUNTER — Encounter (INDEPENDENT_AMBULATORY_CARE_PROVIDER_SITE_OTHER): Payer: Self-pay

## 2016-12-27 ENCOUNTER — Encounter: Payer: Self-pay | Admitting: Primary Care

## 2016-12-27 ENCOUNTER — Ambulatory Visit (INDEPENDENT_AMBULATORY_CARE_PROVIDER_SITE_OTHER): Payer: Managed Care, Other (non HMO) | Admitting: Primary Care

## 2016-12-27 VITALS — BP 118/84 | HR 60 | Temp 97.9°F | Ht 71.0 in | Wt 342.1 lb

## 2016-12-27 DIAGNOSIS — M7041 Prepatellar bursitis, right knee: Secondary | ICD-10-CM

## 2016-12-27 DIAGNOSIS — Z0000 Encounter for general adult medical examination without abnormal findings: Secondary | ICD-10-CM | POA: Diagnosis not present

## 2016-12-27 DIAGNOSIS — G4733 Obstructive sleep apnea (adult) (pediatric): Secondary | ICD-10-CM | POA: Diagnosis not present

## 2016-12-27 DIAGNOSIS — Z23 Encounter for immunization: Secondary | ICD-10-CM | POA: Diagnosis not present

## 2016-12-27 LAB — LIPID PANEL
Cholesterol: 166 mg/dL (ref 0–200)
HDL: 46.1 mg/dL (ref >39.00)
LDL Cholesterol: 105 mg/dL — ABNORMAL HIGH (ref 0–99)
NONHDL: 119.59
Total CHOL/HDL Ratio: 4
Triglycerides: 72 mg/dL (ref 0.0–149.0)
VLDL: 14.4 mg/dL (ref 0.0–40.0)

## 2016-12-27 LAB — COMPREHENSIVE METABOLIC PANEL
ALK PHOS: 38 U/L — AB (ref 39–117)
ALT: 18 U/L (ref 0–53)
AST: 19 U/L (ref 0–37)
Albumin: 4.5 g/dL (ref 3.5–5.2)
BUN: 18 mg/dL (ref 6–23)
CHLORIDE: 101 meq/L (ref 96–112)
CO2: 30 mEq/L (ref 19–32)
CREATININE: 1.02 mg/dL (ref 0.40–1.50)
Calcium: 9.7 mg/dL (ref 8.4–10.5)
GFR: 83.74 mL/min (ref >60.00)
GLUCOSE: 99 mg/dL (ref 70–99)
POTASSIUM: 4.3 meq/L (ref 3.5–5.1)
SODIUM: 138 meq/L (ref 135–145)
TOTAL PROTEIN: 7.6 g/dL (ref 6.0–8.3)
Total Bilirubin: 0.5 mg/dL (ref 0.2–1.2)

## 2016-12-27 NOTE — Assessment & Plan Note (Signed)
Working with ortho.

## 2016-12-27 NOTE — Assessment & Plan Note (Signed)
Continues to use CPAP at night, no problems.

## 2016-12-27 NOTE — Progress Notes (Signed)
Subjective:    Patient ID: David Hansen, male    DOB: [birth year], 46 y.o.   MRN: [id number]  HPI  David Hansen is a 46 year old male who presents today for complete physical.  Immunizations: -Tetanus: Completed in 2016 -Influenza: Due   Diet: He endorses a healthy diet Breakfast: Fruit, toast with peanut butter, cereal Lunch: Chicken, soup Dinner: Vegetables, meat, starch Snacks: Nuts, fruit Desserts: Twice weekly Beverages: Water, un-sweet tea, coffee  Exercise: He does not routinely exercise. Eye exam: Completed 3 years ago, no acute changes Dental exam: Completes semi-annually   Review of Systems  Constitutional: Negative for unexpected weight change.  HENT: Negative for rhinorrhea.   Respiratory: Negative for cough and shortness of breath.   Cardiovascular: Negative for chest pain.  Gastrointestinal: Negative for constipation and diarrhea.  Genitourinary: Negative for difficulty urinating.  Musculoskeletal: Positive for arthralgias. Negative for myalgias.  Skin: Negative for rash.  Allergic/Immunologic: Negative for environmental allergies.  Neurological: Negative for dizziness, numbness and headaches.  Psychiatric/Behavioral:       He denies concerns for anxiety or depression       Past Medical History:  Diagnosis Date  . Depression   . Obesity   . Sleep apnea      Social History   Social History  . Marital status: Married    Spouse name: N/A  . Number of children: 2  . Years of education: N/A   Occupational History  . Land    Social History Main Topics  . Smoking status: Never Smoker  . Smokeless tobacco: Never Used  . Alcohol use 0.0 oz/week     Comment: rarely  . Drug use: No  . Sexual activity: Not on file   Other Topics Concern  . Not on file   Social History Narrative   Regular exercise yes 2-3 times a week...walking      Diet: fruits and veggies,lean protein, water    Past Surgical History:  Procedure  Laterality Date  . KNEE ARTHROSCOPY W/ ACL RECONSTRUCTION AND PATELLA GRAFT Right 1998   [facility] in [place]  Right knee    Family History  Problem Relation Age of Onset  . Cancer Father 71    colon cancer  . Colon cancer Father     No Known Allergies  Current Outpatient Prescriptions on File Prior to Visit  Medication Sig Dispense Refill  . triamcinolone cream (KENALOG) 0.5 % Apply 1 application topically 2 (two) times daily. (Patient not taking: Reported on 12/27/2016) 30 g 0   No current facility-administered medications on file prior to visit.     BP 118/84   Pulse 60   Temp 97.9 F (36.6 C) (Oral)   Ht 5\' 11"  (1.803 m)   Wt (!) 342 lb 1.9 oz (155.2 kg)   SpO2 96%   BMI 47.72 kg/m    Objective:   Physical Exam  Constitutional: He is oriented to person, place, and time. He appears well-nourished.  HENT:  Right Ear: Tympanic membrane and ear canal normal.  Left Ear: Tympanic membrane and ear canal normal.  Nose: Nose normal. Right sinus exhibits no maxillary sinus tenderness and no frontal sinus tenderness. Left sinus exhibits no maxillary sinus tenderness and no frontal sinus tenderness.  Mouth/Throat: Oropharynx is clear and moist.  Mild cerumen build up bilaterally  Eyes: Conjunctivae and EOM are normal. Pupils are equal, round, and reactive to light.  Neck: Neck supple. Carotid bruit is not present. No thyromegaly present.  Cardiovascular: Normal rate, regular rhythm and normal heart sounds.   Pulmonary/Chest: Effort normal and breath sounds normal. He has no wheezes. He has no rales.  Abdominal: Soft. Bowel sounds are normal. There is no tenderness.  Musculoskeletal: Normal range of motion.  Neurological: He is alert and oriented to person, place, and time. He has normal reflexes. No cranial nerve deficit.  Skin: Skin is warm and dry.  Psychiatric: He has a normal mood and affect.          Assessment & Plan:

## 2016-12-27 NOTE — Assessment & Plan Note (Signed)
Diet seems healthy, no exercise. Discussed the importance of a healthy diet and regular exercise in order for weight loss, and to reduce the risk of other medical diseases.

## 2016-12-27 NOTE — Assessment & Plan Note (Signed)
Td UTD, influenza provided today. Discussed the importance of a healthy diet and regular exercise in order for weight loss, and to reduce the risk of other medical diseases. Exam unremarkable. Labs pending. Follow up in 1 year for annual physical.

## 2016-12-27 NOTE — Patient Instructions (Signed)
Complete lab work prior to leaving today. I will notify you of your results once received.   Continue your efforts towards a healthy diet. Incorporate more vegetables, fruit, nuts, whole grains.  Start exercising. You should be getting 150 minutes of moderate intensity exercise weekly.  Ensure you are consuming 64 ounces of water daily.  Follow up in 1 year for your annual exam.  It was a pleasure meeting you!

## 2016-12-28 ENCOUNTER — Encounter: Payer: Self-pay | Admitting: *Deleted

## 2017-03-07 ENCOUNTER — Telehealth: Payer: Self-pay

## 2017-03-07 NOTE — Telephone Encounter (Signed)
Pt left v/m requesting repeat bloodwork done in 12/2016; pt has changed lifestyle and wants to see how changed lab results. Labs were lipid and CMP. Pt request cb.

## 2017-03-08 ENCOUNTER — Telehealth: Payer: Self-pay | Admitting: Family Medicine

## 2017-03-08 NOTE — Telephone Encounter (Signed)
Left message for David Hansen to call and schedule fasting lab appointment at his convenience per Dr. Diona Browner.

## 2017-03-08 NOTE — Telephone Encounter (Signed)
-----   Message from Ellamae Sia sent at 03/08/2017  4:03 PM EDT ----- Regarding: Lab orders for Friday,4.6.18 Lab orders, thanks

## 2017-03-08 NOTE — Telephone Encounter (Signed)
Agreed.. Have pt set up lab appt. I will enter orders when lab requests them.

## 2017-03-09 ENCOUNTER — Other Ambulatory Visit (INDEPENDENT_AMBULATORY_CARE_PROVIDER_SITE_OTHER): Payer: Managed Care, Other (non HMO)

## 2017-03-09 ENCOUNTER — Encounter: Payer: Self-pay | Admitting: *Deleted

## 2017-03-09 LAB — COMPREHENSIVE METABOLIC PANEL
ALT: 17 U/L (ref 0–53)
AST: 18 U/L (ref 0–37)
Albumin: 4.1 g/dL (ref 3.5–5.2)
Alkaline Phosphatase: 32 U/L — ABNORMAL LOW (ref 39–117)
BILIRUBIN TOTAL: 0.6 mg/dL (ref 0.2–1.2)
BUN: 5 mg/dL — AB (ref 6–23)
CO2: 32 meq/L (ref 19–32)
Calcium: 9.4 mg/dL (ref 8.4–10.5)
Chloride: 103 mEq/L (ref 96–112)
Creatinine, Ser: 0.91 mg/dL (ref 0.40–1.50)
GFR: 95.44 mL/min (ref >60.00)
GLUCOSE: 92 mg/dL (ref 70–99)
Potassium: 4.4 mEq/L (ref 3.5–5.1)
SODIUM: 142 meq/L (ref 135–145)
Total Protein: 6.5 g/dL (ref 6.0–8.3)

## 2017-03-09 LAB — LIPID PANEL
CHOL/HDL RATIO: 3
Cholesterol: 99 mg/dL (ref 0–200)
HDL: 30.3 mg/dL — AB (ref >39.00)
LDL Cholesterol: 54 mg/dL (ref 0–99)
NONHDL: 68.63
Triglycerides: 71 mg/dL (ref 0.0–149.0)
VLDL: 14.2 mg/dL (ref 0.0–40.0)

## 2017-03-14 ENCOUNTER — Telehealth: Payer: Self-pay | Admitting: Family Medicine

## 2017-03-14 NOTE — Telephone Encounter (Signed)
Lab results from 03/09/2017 discussed with patient. Patient also notified that a letter with these results were mailed out to him on 03/09/2017, so he should be receiving that any day now.

## 2017-03-14 NOTE — Telephone Encounter (Signed)
Pt called to request a cb when 4/6 labs are released. He is aware Dr Diona Browner is in the office 4/13.

## 2017-04-12 ENCOUNTER — Telehealth: Payer: Self-pay

## 2017-04-12 NOTE — Telephone Encounter (Signed)
Pt left v/m; pt request order for lipid lab test to be rechecked due to lifestyle changes; last lipid 03/09/17. Pt request cb.

## 2017-04-13 NOTE — Telephone Encounter (Signed)
Left message for David Hansen that it takes at least 3 months for cholesterol to change from lifestyle adjustments.  Advised to recheck Lipid sometime after 06/08/2017 per Dr. Diona Browner.

## 2017-04-13 NOTE — Telephone Encounter (Signed)
Takes at least 3 month for chol to change with lifestyle... Schedule recheck 3 months from last.

## 2017-05-29 NOTE — Progress Notes (Signed)
* [facility]     Assessment and Plan:  Obstructive sleep apnea Great compliance with his CPAP and low AHI but he is still sleepy in the am. He notes that his system leaks - both mask and out his mouth when it opens on nasal mask. He is now on a full face mask but still leaking. I suspect that this is the leading cause of his decline. We will work on getting a better mask fit. He wants to try a new company to work with, will refer him to [facility]   Date: 05/29/2017  MRN# [id number] David Hansen 1971/02/14   David Hansen is a 46 y.o. old male seen in follow up for chief complaint of  Chief Complaint  Patient presents with  . Sleep Apnea    David Hansen patient: some tiredness during the day;      HPI:   46 yo man with hx of obesity, sleep apnea dx 10 yrs ago followed in our office by Dr Gwenette Greet. He returns today reporting that he is having more am sleepiness for the last 2 months. He has tried different masks, wonders if he is having leak. He tried full face mask after he noted that he is opening his mouth, has had a fitting. Tried chinstrap at one point. His download shows good control of his OSA on 11 cm H2O, great complaince. His sleep duration hasn';t changed. No medication changes, no change in job.  He has a ResMed with a full face mask, has also used nasal mask in the past. Tried nasal pillows once but didn't work well.   He notices that he has trouble with getting the machine to start right away, it is currently about 46 years old. He is also told by wife that he is sleeping with his mouth open and he is leaking a lot of air. He also notices that he is feeling a bit more tired during the day.    Medication:    Current Outpatient Prescriptions:  .  triamcinolone cream (KENALOG) 0.5 %, Apply 1 application topically 2 (two) times daily. (Patient not taking: Reported on 12/27/2016), Disp: 30 g, Rfl: 0   Allergies:  Patient has no known  allergies.  Review of Systems: Gen:  Denies  fever, sweats. HEENT: Denies blurred vision. Cvc:  No dizziness, chest pain or heaviness Resp:   Denies cough or sputum porduction. Gi: Denies swallowing difficulty, stomach pain. constipation, bowel incontinence Gu:  Denies bladder incontinence, burning urine Ext:   No Joint pain, stiffness. Skin: No skin rash, easy bruising. Endoc:  No polyuria, polydipsia. Psych: No depression, insomnia. Other:  All other systems were reviewed and found to be negative other than what is mentioned in the HPI.   Physical Examination:   VS: BP (!) 142/80 (BP Location: Left Arm, Cuff Size: Normal)   Pulse 64   Ht 6' (1.829 m)   Wt 299 lb (135.6 kg)   SpO2 99%   BMI 40.55 kg/m    General Appearance: No distress  Neuro:without focal findings,  speech normal,  HEENT: PERRLA, EOM intact. Pulmonary: normal breath sounds, No wheezing.   CardiovascularNormal S1,S2.  No m/r/g.   Abdomen: Benign, Soft, non-tender. Renal:  No costovertebral tenderness  GU:  Not performed at this time. Endoc: No evident thyromegaly, no signs of acromegaly. Skin:   warm, no rash. Extremities: normal, no cyanosis, clubbing.   LABORATORY PANEL:   CBC No results for input(s): WBC, HGB, HCT,  PLT in the last 168 hours. ------------------------------------------------------------------------------------------------------------------  Chemistries  No results for input(s): NA, K, CL, CO2, GLUCOSE, BUN, CREATININE, CALCIUM, MG, AST, ALT, ALKPHOS, BILITOT in the last 168 hours.  Invalid input(s): GFRCGP ------------------------------------------------------------------------------------------------------------------  Cardiac Enzymes No results for input(s): TROPONINI in the last 168 hours. ------------------------------------------------------------  RADIOLOGY:    Results for orders placed in visit on 10/07/10  DG Chest 2 View   Narrative Clinical Data: Pleuritic chest  pain.   CHEST - 2 VIEW   Comparison: None.   Findings: The heart size is normal.  The lung volumes are low.  The lungs are clear.  The visualized soft tissues and bony thorax are unremarkable.   IMPRESSION: Negative chest.  Provider: Selinda Orion   ------------------------------------------------------------------------------------------------------------------  Thank  you for allowing [facility] to assist in the care of your patient. Our recommendations are noted above.  Please contact us if we can be of further service.   Marda Stalker, MD.  [facility] Office Number: [phone]  Patricia Pesa, M.D.  Merton Border, M.D  05/29/2017

## 2017-06-04 ENCOUNTER — Ambulatory Visit (INDEPENDENT_AMBULATORY_CARE_PROVIDER_SITE_OTHER): Payer: Managed Care, Other (non HMO) | Admitting: Internal Medicine

## 2017-06-04 ENCOUNTER — Encounter: Payer: Self-pay | Admitting: Internal Medicine

## 2017-06-04 VITALS — BP 142/80 | HR 64 | Ht 72.0 in | Wt 299.0 lb

## 2017-06-04 DIAGNOSIS — G4733 Obstructive sleep apnea (adult) (pediatric): Secondary | ICD-10-CM | POA: Diagnosis not present

## 2017-06-04 NOTE — Patient Instructions (Addendum)
--  Will start auto-titrating CPAP with pressure range of 5-20 cm H2O.  --Will re-prescribe CPAP supplies.

## 2017-06-11 ENCOUNTER — Ambulatory Visit: Payer: Managed Care, Other (non HMO) | Admitting: Adult Health

## 2018-05-22 ENCOUNTER — Other Ambulatory Visit (INDEPENDENT_AMBULATORY_CARE_PROVIDER_SITE_OTHER): Payer: Managed Care, Other (non HMO)

## 2018-05-22 DIAGNOSIS — Z0000 Encounter for general adult medical examination without abnormal findings: Secondary | ICD-10-CM | POA: Diagnosis not present

## 2018-05-22 LAB — CBC WITH DIFFERENTIAL/PLATELET
BASOS PCT: 1 % (ref 0.0–3.0)
Basophils Absolute: 0 10*3/uL (ref 0.0–0.1)
EOS ABS: 0.2 10*3/uL (ref 0.0–0.7)
Eosinophils Relative: 4.5 % (ref 0.0–5.0)
HEMATOCRIT: 42.9 % (ref 39.0–52.0)
Hemoglobin: 14.5 g/dL (ref 13.0–17.0)
LYMPHS ABS: 1.3 10*3/uL (ref 0.7–4.0)
LYMPHS PCT: 26.3 % (ref 12.0–46.0)
MCHC: 33.8 g/dL (ref 30.0–36.0)
MCV: 88.2 fl (ref 78.0–100.0)
MONO ABS: 0.5 10*3/uL (ref 0.1–1.0)
Monocytes Relative: 9.6 % (ref 3.0–12.0)
NEUTROS ABS: 2.8 10*3/uL (ref 1.4–7.7)
NEUTROS PCT: 58.6 % (ref 43.0–77.0)
PLATELETS: 229 10*3/uL (ref 150.0–400.0)
RBC: 4.87 Mil/uL (ref 4.22–5.81)
RDW: 13.5 % (ref 11.5–15.5)
WBC: 4.8 10*3/uL (ref 4.0–10.5)

## 2018-05-22 LAB — COMPREHENSIVE METABOLIC PANEL
ALK PHOS: 38 U/L — AB (ref 39–117)
ALT: 12 U/L (ref 0–53)
AST: 11 U/L (ref 0–37)
Albumin: 4.2 g/dL (ref 3.5–5.2)
BILIRUBIN TOTAL: 0.6 mg/dL (ref 0.2–1.2)
BUN: 19 mg/dL (ref 6–23)
CALCIUM: 9.5 mg/dL (ref 8.4–10.5)
CO2: 30 meq/L (ref 19–32)
Chloride: 103 mEq/L (ref 96–112)
Creatinine, Ser: 1.08 mg/dL (ref 0.40–1.50)
GFR: 77.91 mL/min (ref >60.00)
Glucose, Bld: 108 mg/dL — ABNORMAL HIGH (ref 70–99)
POTASSIUM: 4.4 meq/L (ref 3.5–5.1)
Sodium: 139 mEq/L (ref 135–145)
TOTAL PROTEIN: 6.8 g/dL (ref 6.0–8.3)

## 2018-05-22 LAB — LIPID PANEL
CHOL/HDL RATIO: 4
CHOLESTEROL: 179 mg/dL (ref 0–200)
HDL: 47 mg/dL (ref >39.00)
LDL CALC: 114 mg/dL — AB (ref 0–99)
NonHDL: 132.15
TRIGLYCERIDES: 90 mg/dL (ref 0.0–149.0)
VLDL: 18 mg/dL (ref 0.0–40.0)

## 2018-05-22 LAB — TSH: TSH: 4.8 u[IU]/mL — AB (ref 0.35–4.50)

## 2018-05-22 LAB — PSA: PSA: 1.07 ng/mL (ref 0.10–4.00)

## 2018-05-27 ENCOUNTER — Telehealth: Payer: Self-pay | Admitting: Primary Care

## 2018-05-27 NOTE — Telephone Encounter (Signed)
Hey. Could you can patient to schedule the physical?  David Hansen is out of the office until 06/12/18. We can put him at 12:15pm on Wed 06/12/2018 and take off that same day. We can also see him on Thurs 06/13/2018 at 12 pm.

## 2018-05-27 NOTE — Telephone Encounter (Signed)
See below  Ok to schedule?  Copied from [place] [id number]. Topic: Appointment Scheduling - Prior Auth Required for Appointment >> May 27, 2018  9:33 AM Ahmed Prima L wrote: Patient is needing a physical by 7/15. He was scheduled for 6/25 but had to cancel due to work. 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 You can reach him at [phone]

## 2018-05-28 ENCOUNTER — Encounter: Payer: Managed Care, Other (non HMO) | Admitting: Primary Care

## 2018-06-12 ENCOUNTER — Encounter: Payer: Self-pay | Admitting: Primary Care

## 2018-06-12 ENCOUNTER — Ambulatory Visit (INDEPENDENT_AMBULATORY_CARE_PROVIDER_SITE_OTHER): Payer: Managed Care, Other (non HMO) | Admitting: Primary Care

## 2018-06-12 VITALS — BP 118/78 | HR 70 | Temp 98.1°F | Ht 72.0 in | Wt 330.5 lb

## 2018-06-12 DIAGNOSIS — Z800 Family history of malignant neoplasm of digestive organs: Secondary | ICD-10-CM | POA: Diagnosis not present

## 2018-06-12 DIAGNOSIS — R7989 Other specified abnormal findings of blood chemistry: Secondary | ICD-10-CM | POA: Diagnosis not present

## 2018-06-12 DIAGNOSIS — Z0000 Encounter for general adult medical examination without abnormal findings: Secondary | ICD-10-CM | POA: Diagnosis not present

## 2018-06-12 DIAGNOSIS — R7303 Prediabetes: Secondary | ICD-10-CM | POA: Insufficient documentation

## 2018-06-12 DIAGNOSIS — R739 Hyperglycemia, unspecified: Secondary | ICD-10-CM | POA: Diagnosis not present

## 2018-06-12 DIAGNOSIS — Z1211 Encounter for screening for malignant neoplasm of colon: Secondary | ICD-10-CM | POA: Diagnosis not present

## 2018-06-12 DIAGNOSIS — G4733 Obstructive sleep apnea (adult) (pediatric): Secondary | ICD-10-CM | POA: Diagnosis not present

## 2018-06-12 DIAGNOSIS — L301 Dyshidrosis [pompholyx]: Secondary | ICD-10-CM | POA: Diagnosis not present

## 2018-06-12 LAB — POCT GLYCOSYLATED HEMOGLOBIN (HGB A1C): Hemoglobin A1C: 5.5 % (ref 4.0–5.6)

## 2018-06-12 MED ORDER — TRIAMCINOLONE ACETONIDE 0.5 % EX CREA: 1.0000 "application " | TOPICAL_CREAM | Freq: Two times a day (BID) | CUTANEOUS | 0 refills | Status: DC

## 2018-06-12 NOTE — Assessment & Plan Note (Signed)
Intermittent flares, mostly during winter months on average. Doing well on triamcinolone.

## 2018-06-12 NOTE — Assessment & Plan Note (Signed)
Weight gain of 31 pounds since last visit, encouraged him to work on diet and exercise. He is feeling motivated.

## 2018-06-12 NOTE — Patient Instructions (Addendum)
Stop by the lab prior to leaving today. I will notify you of your results once received.   You will be contacted regarding your referral to GI for the colonoscopy.  Please let us know if you have not been contacted within one week.   Continue exercising. You should be getting 150 minutes of moderate intensity exercise weekly.  Ensure you are consuming 64 ounces of water daily.  Schedule a lab only appointment in 6 weeks to recheck your thyroid function.  Follow up in 1 year for your annual exam or sooner if needed.  It was a pleasure to see you today!  Preventive Care 40-64 Years, Male Preventive care refers to lifestyle choices and visits with your health care provider that can promote health and wellness. What does preventive care include?  A yearly physical exam. This is also called an annual well check.  Dental exams once or twice a year.  Routine eye exams. Ask your health care provider how often you should have your eyes checked.  Personal lifestyle choices, including: ? Daily care of your teeth and gums. ? Regular physical activity. ? Eating a healthy diet. ? Avoiding tobacco and drug use. ? Limiting alcohol use. ? Practicing safe sex. ? Taking low-dose aspirin every day starting at age 37. What happens during an annual well check? The services and screenings done by your health care provider during your annual well check will depend on your age, overall health, lifestyle risk factors, and family history of disease. Counseling Your health care provider may ask you questions about your:  Alcohol use.  Tobacco use.  Drug use.  Emotional well-being.  Home and relationship well-being.  Sexual activity.  Eating habits.  Work and work Statistician.  Screening You may have the following tests or measurements:  Height, weight, and BMI.  Blood pressure.  Lipid and cholesterol levels. These may be checked every 5 years, or more frequently if you are over 13  years old.  Skin check.  Lung cancer screening. You may have this screening every year starting at age 40 if you have a 30-pack-year history of smoking and currently smoke or have quit within the past 15 years.  Fecal occult blood test (FOBT) of the stool. You may have this test every year starting at age 52.  Flexible sigmoidoscopy or colonoscopy. You may have a sigmoidoscopy every 5 years or a colonoscopy every 10 years starting at age 63.  Prostate cancer screening. Recommendations will vary depending on your family history and other risks.  Hepatitis C blood test.  Hepatitis B blood test.  Sexually transmitted disease (STD) testing.  Diabetes screening. This is done by checking your blood sugar (glucose) after you have not eaten for a while (fasting). You may have this done every 1-3 years.  Discuss your test results, treatment options, and if necessary, the need for more tests with your health care provider. Vaccines Your health care provider may recommend certain vaccines, such as:  Influenza vaccine. This is recommended every year.  Tetanus, diphtheria, and acellular pertussis (Tdap, Td) vaccine. You may need a Td booster every 10 years.  Varicella vaccine. You may need this if you have not been vaccinated.  Zoster vaccine. You may need this after age 50.  Measles, mumps, and rubella (MMR) vaccine. You may need at least one dose of MMR if you were born in 1957 or later. You may also need a second dose.  Pneumococcal 13-valent conjugate (PCV13) vaccine. You may need this if  you have certain conditions and have not been vaccinated.  Pneumococcal polysaccharide (PPSV23) vaccine. You may need one or two doses if you smoke cigarettes or if you have certain conditions.  Meningococcal vaccine. You may need this if you have certain conditions.  Hepatitis A vaccine. You may need this if you have certain conditions or if you travel or work in places where you may be exposed to  hepatitis A.  Hepatitis B vaccine. You may need this if you have certain conditions or if you travel or work in places where you may be exposed to hepatitis B.  Haemophilus influenzae type b (Hib) vaccine. You may need this if you have certain risk factors.  Talk to your health care provider about which screenings and vaccines you need and how often you need them. This information is not intended to replace advice given to you by your health care provider. Make sure you discuss any questions you have with your health care provider. Document Released: 12/17/2015 Document Revised: 08/09/2016 Document Reviewed: 09/21/2015 Elsevier Interactive Patient Education  [employer].

## 2018-06-12 NOTE — Assessment & Plan Note (Signed)
Immunizations UTD. Discussed the importance of a healthy diet and regular exercise in order for weight loss, and to reduce the risk of any potential medical problems. Colonoscopy overdue, referral placed.  Exam unremarkable. Labs overall stable, check A1C today for hyperglycemia. Follow up in 1 year for CPE.

## 2018-06-12 NOTE — Assessment & Plan Note (Signed)
Fasting glucose reading of 108, A1C pending.

## 2018-06-12 NOTE — Progress Notes (Signed)
Subjective:    Patient ID: David Hansen, male    DOB: [birth year], 47 y.o.   MRN: [id number]  HPI  David Hansen is a 47 year old male who presents today for complete physical. He is needing a form completed for camp.  Immunizations: -Tetanus: Completed in 2016 -Influenza: Did not complete last season  BP Readings from Last 3 Encounters:  06/12/18 118/78  06/04/17 (!) 142/80  12/27/16 118/84   Wt Readings from Last 3 Encounters:  06/12/18 (!) 330 lb 8 oz (149.9 kg)  06/04/17 299 lb (135.6 kg)  12/27/16 (!) 342 lb 1.9 oz (155.2 kg)      Diet: He endorses a healthy diet Breakfast: Avocado with meat Lunch: Avocado, meat, cheese Dinner: Meat, vegetable, lower starch Snacks: Meat, veggies, occasional cookies Desserts: Daily Beverages: Water, un-sweet tea, coffee  Exercise: He is walking 2-3 miles most everyday Eye exam: 2-3 years ago Dental exam: Completes semi-annually Colonoscopy: Completed in 2012, due in 2017   Review of Systems  Constitutional: Negative for unexpected weight change.  HENT: Negative for rhinorrhea.   Respiratory: Negative for cough and shortness of breath.   Cardiovascular: Negative for chest pain.  Gastrointestinal: Negative for constipation and diarrhea.  Genitourinary: Negative for difficulty urinating.  Musculoskeletal: Negative for arthralgias and myalgias.  Skin: Negative for rash.  Allergic/Immunologic: Negative for environmental allergies.  Neurological: Negative for dizziness, numbness and headaches.  Psychiatric/Behavioral:       Daily stress, manages with self calming techniques       Past Medical History:  Diagnosis Date  . Chicken pox   . Depression   . Obesity   . Sleep apnea      Social History   Socioeconomic History  . Marital status: Married    Spouse name: Not on file  . Number of children: 2  . Years of education: Not on file  . Highest education level: Not on file  Occupational History  . Occupation:  Land  Social Needs  . Financial resource strain: Not on file  . Food insecurity:    Worry: Not on file    Inability: Not on file  . Transportation needs:    Medical: Not on file    Non-medical: Not on file  Tobacco Use  . Smoking status: Never Smoker  . Smokeless tobacco: Never Used  Substance and Sexual Activity  . Alcohol use: Yes    Alcohol/week: 0.0 oz    Comment: rarely  . Drug use: No  . Sexual activity: Not on file  Lifestyle  . Physical activity:    Days per week: Not on file    Minutes per session: Not on file  . Stress: Not on file  Relationships  . Social connections:    Talks on phone: Not on file    Gets together: Not on file    Attends religious service: Not on file    Active member of club or organization: Not on file    Attends meetings of clubs or organizations: Not on file    Relationship status: Not on file  . Intimate partner violence:    Fear of current or ex partner: Not on file    Emotionally abused: Not on file    Physically abused: Not on file    Forced sexual activity: Not on file  Other Topics Concern  . Not on file  Social History Narrative   Regular exercise yes 2-3 times a week...walking      Diet:  fruits and veggies,lean protein, water    Past Surgical History:  Procedure Laterality Date  . KNEE ARTHROSCOPY W/ ACL RECONSTRUCTION AND PATELLA GRAFT Right 1998   [facility] in [place]  Right knee    Family History  Problem Relation Age of Onset  . Cancer Father 37       colon cancer  . Colon cancer Father     No Known Allergies  Current Outpatient Medications on File Prior to Visit  Medication Sig Dispense Refill  . triamcinolone cream (KENALOG) 0.5 % Apply 1 application topically 2 (two) times daily. 30 g 0   No current facility-administered medications on file prior to visit.     BP 118/78   Pulse 70   Temp 98.1 F (36.7 C) (Oral)   Ht 6' (1.829 m)   Wt (!) 330 lb 8 oz (149.9 kg)   SpO2 97%   BMI  44.82 kg/m    Objective:   Physical Exam  Constitutional: He is oriented to person, place, and time. He appears well-nourished.  HENT:  Mouth/Throat: No oropharyngeal exudate.  Eyes: Pupils are equal, round, and reactive to light. EOM are normal.  Neck: Neck supple. No thyromegaly present.  Cardiovascular: Normal rate and regular rhythm.  Respiratory: Effort normal and breath sounds normal.  GI: Soft. Bowel sounds are normal. There is no tenderness.  Musculoskeletal: Normal range of motion.  Neurological: He is alert and oriented to person, place, and time.  Skin: Skin is warm and dry.  Psychiatric: He has a normal mood and affect.           Assessment & Plan:

## 2018-06-12 NOTE — Assessment & Plan Note (Signed)
Compliant to CPAP machine, following with pulmonology. 

## 2018-06-12 NOTE — Assessment & Plan Note (Signed)
Repeat colonoscopy overdue, referral placed to GI for repeat.

## 2018-06-21 ENCOUNTER — Encounter: Payer: Self-pay | Admitting: *Deleted

## 2018-07-11 ENCOUNTER — Encounter: Payer: Self-pay | Admitting: General Surgery

## 2018-07-11 ENCOUNTER — Ambulatory Visit (INDEPENDENT_AMBULATORY_CARE_PROVIDER_SITE_OTHER): Payer: Managed Care, Other (non HMO) | Admitting: General Surgery

## 2018-07-11 VITALS — BP 132/70 | HR 84 | Resp 12 | Ht 72.0 in | Wt 327.0 lb

## 2018-07-11 DIAGNOSIS — Z86010 Personal history of colonic polyps: Secondary | ICD-10-CM

## 2018-07-11 DIAGNOSIS — Z800 Family history of malignant neoplasm of digestive organs: Secondary | ICD-10-CM | POA: Diagnosis not present

## 2018-07-11 MED ORDER — POLYETHYLENE GLYCOL 3350 17 GM/SCOOP PO POWD: 1.0000 | Freq: Once | ORAL | 0 refills | Status: AC

## 2018-07-11 NOTE — Patient Instructions (Addendum)
The patient is aware to call back for any questions or concerns. Bring CPAP day of procedure.  Colonoscopy, Adult A colonoscopy is an exam to look at the entire large intestine. During the exam, a lubricated, bendable tube is inserted into the anus and then passed into the rectum, colon, and other parts of the large intestine. A colonoscopy is often done as a part of normal colorectal screening or in response to certain symptoms, such as anemia, persistent diarrhea, abdominal pain, and blood in the stool. The exam can help screen for and diagnose medical problems, including:  Tumors.  Polyps.  Inflammation.  Areas of bleeding.  Tell a health care provider about:  Any allergies you have.  All medicines you are taking, including vitamins, herbs, eye drops, creams, and over-the-counter medicines.  Any problems you or family members have had with anesthetic medicines.  Any blood disorders you have.  Any surgeries you have had.  Any medical conditions you have.  Any problems you have had passing stool. What are the risks? Generally, this is a safe procedure. However, problems may occur, including:  Bleeding.  A tear in the intestine.  A reaction to medicines given during the exam.  Infection (rare).  What happens before the procedure? Eating and drinking restrictions Follow instructions from your health care provider about eating and drinking, which may include:  A few days before the procedure - follow a low-fiber diet. Avoid nuts, seeds, dried fruit, raw fruits, and vegetables.  1-3 days before the procedure - follow a clear liquid diet. Drink only clear liquids, such as clear broth or bouillon, black coffee or tea, clear juice, clear soft drinks or sports drinks, gelatin dessert, and popsicles. Avoid any liquids that contain red or purple dye.  On the day of the procedure - do not eat or drink anything during the 2 hours before the procedure, or within the time period  that your health care provider recommends.  Bowel prep If you were prescribed an oral bowel prep to clean out your colon:  Take it as told by your health care provider. Starting the day before your procedure, you will need to drink a large amount of medicated liquid. The liquid will cause you to have multiple loose stools until your stool is almost clear or light green.  If your skin or anus gets irritated from diarrhea, you may use these to relieve the irritation: ? Medicated wipes, such as adult wet wipes with aloe and vitamin E. ? A skin soothing-product like petroleum jelly.  If you vomit while drinking the bowel prep, take a break for up to 60 minutes and then begin the bowel prep again. If vomiting continues and you cannot take the bowel prep without vomiting, call your health care provider.  General instructions  Ask your health care provider about changing or stopping your regular medicines. This is especially important if you are taking diabetes medicines or blood thinners.  Plan to have someone take you home from the hospital or clinic. What happens during the procedure?  An IV tube may be inserted into one of your veins.  You will be given medicine to help you relax (sedative).  To reduce your risk of infection: ? Your health care team will wash or sanitize their hands. ? Your anal area will be washed with soap.  You will be asked to lie on your side with your knees bent.  Your health care provider will lubricate a long, thin, flexible tube. The tube  will have a camera and a light on the end.  The tube will be inserted into your anus.  The tube will be gently eased through your rectum and colon.  Air will be delivered into your colon to keep it open. You may feel some pressure or cramping.  The camera will be used to take images during the procedure.  A small tissue sample may be removed from your body to be examined under a microscope (biopsy). If any potential  problems are found, the tissue will be sent to a lab for testing.  If small polyps are found, your health care provider may remove them and have them checked for cancer cells.  The tube that was inserted into your anus will be slowly removed. The procedure may vary among health care providers and hospitals. What happens after the procedure?  Your blood pressure, heart rate, breathing rate, and blood oxygen level will be monitored until the medicines you were given have worn off.  Do not drive for 24 hours after the exam.  You may have a small amount of blood in your stool.  You may pass gas and have mild abdominal cramping or bloating due to the air that was used to inflate your colon during the exam.  It is up to you to get the results of your procedure. Ask your health care provider, or the department performing the procedure, when your results will be ready. This information is not intended to replace advice given to you by your health care provider. Make sure you discuss any questions you have with your health care provider. Document Released: 11/17/2000 Document Revised: 09/20/2016 Document Reviewed: 02/01/2016 Elsevier Interactive Patient Education  [employer].   The patient is scheduled for a Colonoscopy at [facility] on 07/24/18. They are aware to call the day before to get their arrival time. He will stop his Fish Oil one week prior. He will take his CPaP machine with him the day of. Miralax prescription has been sent into the patient's pharmacy. The patient is aware of date and instructions.

## 2018-07-11 NOTE — Progress Notes (Signed)
Patient ID: David Hansen, male   DOB: 04-04-1971, 47 y.o.   MRN: [id number]  Chief Complaint  Patient presents with  . Colonoscopy    HPI David Hansen is a 47 y.o. male here today for a evaluation of a screening colonoscopy. Patient states he moves his bowels daily. No GI problems at this time. Last colonoscopy was 07/27/2011. Father had colon cancer at the age of 13.  He has had a 40 pound intentional weight loss 6 months ago and has put 20 pounds back on. Wife is patient, Denny Peon.  HPI  Past Medical History:  Diagnosis Date  . Chicken pox   . Colon polyp 07/27/2011  . Depression   . Obesity   . Sleep apnea 2008   CPAP    Past Surgical History:  Procedure Laterality Date  . COLONOSCOPY  07/27/2011   Dr Fuller Plan  . KNEE ARTHROSCOPY W/ ACL RECONSTRUCTION AND PATELLA GRAFT Right 1998   [facility] in [place]  Right knee    Family History  Problem Relation Age of Onset  . Cancer Father 52       colon cancer    Social History Social History   Tobacco Use  . Smoking status: Never Smoker  . Smokeless tobacco: Never Used  Substance Use Topics  . Alcohol use: Yes    Alcohol/week: 0.0 standard drinks    Comment: rarely  . Drug use: No    No Known Allergies  Current Outpatient Medications  Medication Sig Dispense Refill  . Omega-3 Fatty Acids (FISH OIL) 1200 MG CAPS Take by mouth.    . triamcinolone cream (KENALOG) 0.5 % Apply 1 application topically 2 (two) times daily. 30 g 0   No current facility-administered medications for this visit.     Review of Systems Review of Systems  Constitutional: Negative.   Respiratory: Negative.   Cardiovascular: Negative.     Blood pressure 132/70, pulse 84, resp. rate 12, height 6' (1.829 m), weight (!) 327 lb (148.3 kg).  Physical Exam Physical Exam  Constitutional: He is oriented to person, place, and time. He appears well-developed and well-nourished.  HENT:  Mouth/Throat: Oropharynx is clear and moist. No  oropharyngeal exudate.  Eyes: Conjunctivae are normal. No scleral icterus.  Neck: Neck supple.  Cardiovascular: Normal rate, regular rhythm and normal heart sounds.  Pulmonary/Chest: Effort normal and breath sounds normal.  Neurological: He is alert and oriented to person, place, and time.  Skin: Skin is warm and dry.  Psychiatric: His behavior is normal.    Data Reviewed Colonoscopy completed July 27, 2011 by Lucio Edward, MD:  Diagnosis Surgical [P], hepatic flexure - TUBULAR ADENOMA (X1). - NO HIGH GRADE DYSPLASIA OR MALIGNANCY IDENTIFIED.  Assessment    Candidate for repeat colonoscopy.    Plan  Bring CPAP day of procedure.  Colonoscopy with possible biopsy/polypectomy prn: Information regarding the procedure, including its potential risks and complications (including but not limited to perforation of the bowel, which may require emergency surgery to repair, and bleeding) was verbally given to the patient. Educational information regarding lower intestinal endoscopy was given to the patient. Written instructions for how to complete the bowel prep using Miralax were provided. The importance of drinking ample fluids to avoid dehydration as a result of the prep emphasized.  HPI, Physical Exam, Assessment and Plan have been scribed under the direction and in the presence of Hervey Ard, MD.  Gaspar Cola, CMA  I have completed the exam and reviewed the above documentation for  accuracy and completeness.  I agree with the above.  Haematologist has been used and any errors in dictation or transcription are unintentional.  Hervey Ard, M.D., F.A.C.S.  The patient is scheduled for a Colonoscopy at [facility] on 07/24/18. They are aware to call the day before to get their arrival time. He will stop his Fish Oil one week prior. He will take his CPaP machine with him the day of. Miralax prescription has been sent into the patient's pharmacy. The patient is aware of date and  instructions.  Documented by Caryl-Lyn Otis Brace LPN   [name] Lucious Zou 07/12/2018, 1:15 PM

## 2018-07-12 DIAGNOSIS — Z860100 Personal history of colon polyps, unspecified: Secondary | ICD-10-CM | POA: Insufficient documentation

## 2018-07-23 ENCOUNTER — Telehealth: Payer: Self-pay | Admitting: *Deleted

## 2018-07-23 ENCOUNTER — Encounter: Payer: Self-pay | Admitting: *Deleted

## 2018-07-23 NOTE — Telephone Encounter (Signed)
Patient called and has some questions regarding his prep for his colonoscopy tomorrow. Please advise

## 2018-07-23 NOTE — Telephone Encounter (Signed)
Instructions reviewed with patient

## 2018-07-24 ENCOUNTER — Encounter: Payer: Self-pay | Admitting: *Deleted

## 2018-07-24 ENCOUNTER — Ambulatory Visit
Admission: RE | Admit: 2018-07-24 | Discharge: 2018-07-24 | Disposition: A | Discharge status: Home / Self Care | Payer: Managed Care, Other (non HMO) | Source: Ambulatory Visit | Attending: General Surgery | Admitting: General Surgery

## 2018-07-24 ENCOUNTER — Encounter
Admission: RE | Disposition: A | Discharge status: Home / Self Care | Payer: Self-pay | Source: Ambulatory Visit | Attending: General Surgery

## 2018-07-24 ENCOUNTER — Ambulatory Visit: Payer: Managed Care, Other (non HMO) | Admitting: Certified Registered Nurse Anesthetist

## 2018-07-24 DIAGNOSIS — Z1211 Encounter for screening for malignant neoplasm of colon: Secondary | ICD-10-CM | POA: Diagnosis not present

## 2018-07-24 DIAGNOSIS — Z86010 Personal history of colonic polyps: Secondary | ICD-10-CM | POA: Diagnosis not present

## 2018-07-24 DIAGNOSIS — Z6841 Body Mass Index (BMI) 40.0 and over, adult: Secondary | ICD-10-CM | POA: Diagnosis not present

## 2018-07-24 DIAGNOSIS — Z79899 Other long term (current) drug therapy: Secondary | ICD-10-CM | POA: Insufficient documentation

## 2018-07-24 DIAGNOSIS — D123 Benign neoplasm of transverse colon: Secondary | ICD-10-CM | POA: Diagnosis not present

## 2018-07-24 DIAGNOSIS — Z800 Family history of malignant neoplasm of digestive organs: Secondary | ICD-10-CM | POA: Insufficient documentation

## 2018-07-24 DIAGNOSIS — G4730 Sleep apnea, unspecified: Secondary | ICD-10-CM | POA: Diagnosis not present

## 2018-07-24 DIAGNOSIS — F329 Major depressive disorder, single episode, unspecified: Secondary | ICD-10-CM | POA: Insufficient documentation

## 2018-07-24 DIAGNOSIS — D124 Benign neoplasm of descending colon: Secondary | ICD-10-CM | POA: Diagnosis not present

## 2018-07-24 DIAGNOSIS — D122 Benign neoplasm of ascending colon: Secondary | ICD-10-CM | POA: Insufficient documentation

## 2018-07-24 HISTORY — PX: COLONOSCOPY WITH PROPOFOL: SHX5780

## 2018-07-24 SURGERY — COLONOSCOPY WITH PROPOFOL
Anesthesia: General

## 2018-07-24 MED ORDER — PROPOFOL 500 MG/50ML IV EMUL
INTRAVENOUS | Status: DC | PRN
  Administered 2018-07-24: 175 ug/kg/min via INTRAVENOUS

## 2018-07-24 MED ORDER — PROPOFOL 10 MG/ML IV BOLUS
INTRAVENOUS | Status: DC | PRN
  Administered 2018-07-24: 70 mg via INTRAVENOUS

## 2018-07-24 MED ORDER — SODIUM CHLORIDE 0.9 % IV SOLN
INTRAVENOUS | Status: DC
  Administered 2018-07-24: 1000 mL via INTRAVENOUS

## 2018-07-24 MED ORDER — PHENYLEPHRINE HCL 10 MG/ML IJ SOLN
INTRAMUSCULAR | Status: DC | PRN
  Administered 2018-07-24 (×2): 100 ug via INTRAVENOUS

## 2018-07-24 MED ORDER — PROPOFOL 500 MG/50ML IV EMUL
INTRAVENOUS | Status: AC
  Filled 2018-07-24: qty 50

## 2018-07-24 MED ORDER — LIDOCAINE HCL (CARDIAC) PF 100 MG/5ML IV SOSY
PREFILLED_SYRINGE | INTRAVENOUS | Status: DC | PRN
  Administered 2018-07-24: 50 mg via INTRAVENOUS

## 2018-07-24 NOTE — Anesthesia Postprocedure Evaluation (Signed)
Anesthesia Post Note  Patient: David Hansen  Procedure(s) Performed: COLONOSCOPY WITH PROPOFOL (N/A )  Patient location during evaluation: PACU Anesthesia Type: General Level of consciousness: awake and alert Pain management: pain level controlled Vital Signs Assessment: post-procedure vital signs reviewed and stable Respiratory status: spontaneous breathing, nonlabored ventilation and respiratory function stable Cardiovascular status: blood pressure returned to baseline and stable Postop Assessment: no apparent nausea or vomiting Anesthetic complications: no     Last Vitals:  Vitals:   07/24/18 1138 07/24/18 1140  BP:    Pulse: (!) 56 (!) 58  Resp: 14 19  Temp:    SpO2: 99% 100%    Last Pain:  Vitals:   07/24/18 0926  TempSrc: Tympanic  PainSc: 0-No pain                 Durenda Hurt

## 2018-07-24 NOTE — Op Note (Signed)
[facility] Gastroenterology Patient Name: David Hansen Procedure Date: 07/24/2018 10:34 AM MRN: [id number] Account #: [id number] Date of Birth: [birth year] Admit Type: Outpatient Age: 47 Room: [facility] ENDO ROOM 1 Gender: Male Note Status: Finalized Procedure:            Colonoscopy Indications:          High risk colon cancer surveillance: Personal history                        of colonic polyps, Family history of colon cancer in a                        first-degree relative Providers:            Robert Bellow, MD Referring MD:         Jinny Sanders MD, MD (Referring MD) Medicines:            Monitored Anesthesia Care Complications:        No immediate complications. Procedure:            Pre-Anesthesia Assessment:                       - Prior to the procedure, a History and Physical was                        performed, and patient medications, allergies and                        sensitivities were reviewed. The patient's tolerance of                        previous anesthesia was reviewed.                       - The risks and benefits of the procedure and the                        sedation options and risks were discussed with the                        patient. All questions were answered and informed                        consent was obtained.                       After obtaining informed consent, the colonoscope was                        passed under direct vision. Throughout the procedure,                        the patient's blood pressure, pulse, and oxygen                        saturations were monitored continuously. The                        Colonoscope was introduced through the anus and  advanced to the the cecum, identified by appendiceal                        orifice and ileocecal valve. The colonoscopy was                        performed without difficulty. The patient tolerated the   procedure well. The quality of the bowel preparation                        was excellent. Findings:      A 8 mm polyp was found in the hepatic flexure. The polyp was sessile.       The polyp was removed with a cold snare. Resection and retrieval were       complete.      A 6 mm polyp was found in the ascending colon. The polyp was sessile.       This was biopsied with a cold forceps for histology.      The retroflexed view of the distal rectum and anal verge was normal and       showed no anal or rectal abnormalities. Impression:           - One 8 mm polyp at the hepatic flexure, removed with a                        cold snare. Resected and retrieved.                       - One 6 mm polyp in the ascending colon. Biopsied.                       - The distal rectum and anal verge are normal on                        retroflexion view. Recommendation:       - Telephone endoscopist for pathology results in 1 week.                       - Repeat colonoscopy in 5 years for surveillance. Procedure Code(s):    --- Professional ---                       [phone], Colonoscopy, flexible; with removal of tumor(s),                        polyp(s), or other lesion(s) by snare technique                       45380, 11, Colonoscopy, flexible; with biopsy, single                        or multiple Diagnosis Code(s):    --- Professional ---                       Z86.010, Personal history of colonic polyps                       D12.3, Benign neoplasm of transverse colon (hepatic  flexure or splenic flexure)                       D12.2, Benign neoplasm of ascending colon                       Z80.0, Family history of malignant neoplasm of                        digestive organs CPT copyright 2017 American Medical Association. All rights reserved. The codes documented in this report are preliminary and upon coder review may  be revised to meet current compliance requirements. Robert Bellow, MD 07/24/2018 11:09:19 AM This report has been signed electronically. Number of Addenda: 0 Note Initiated On: 07/24/2018 10:34 AM Scope Withdrawal Time: 0 hours 14 minutes 44 seconds  Total Procedure Duration: 0 hours 22 minutes 43 seconds       [facility]

## 2018-07-24 NOTE — Anesthesia Procedure Notes (Signed)
Date/Time: 07/24/2018 10:37 AM Performed by: Johnna Acosta, CRNA Pre-anesthesia Checklist: Patient identified, Emergency Drugs available, Suction available, Patient being monitored and Timeout performed Patient Re-evaluated:Patient Re-evaluated prior to induction Oxygen Delivery Method: Nasal cannula Preoxygenation: Pre-oxygenation with 100% oxygen

## 2018-07-24 NOTE — Transfer of Care (Signed)
Immediate Anesthesia Transfer of Care Note  Patient: ARICK MARENO  Procedure(s) Performed: COLONOSCOPY WITH PROPOFOL (N/A )  Patient Location: PACU  Anesthesia Type:General  Level of Consciousness: awake, alert  and oriented  Airway & Oxygen Therapy: Patient Spontanous Breathing and Patient connected to nasal cannula oxygen  Post-op Assessment: Report given to RN and Post -op Vital signs reviewed and stable  Post vital signs: Reviewed and stable  Last Vitals:  Vitals Value Taken Time  BP 88/47 07/24/2018 11:13 AM  Temp 36.1 C 07/24/2018 11:13 AM  Pulse 64 07/24/2018 11:13 AM  Resp 14 07/24/2018 11:13 AM  SpO2 99 % 07/24/2018 11:13 AM  Vitals shown include unvalidated device data.  Last Pain:  Vitals:   07/24/18 0926  TempSrc: Tympanic  PainSc: 0-No pain         Complications: No apparent anesthesia complications

## 2018-07-24 NOTE — H&P (Signed)
David Hansen [id number] December 31, 1970     HPI:  47 y/o male with family history of colon cancer, personal history of polyps, for colonoscopoy. Tolerated prep well.  Polyp at hepatic flexure in 2012.   Medications Prior to Admission  Medication Sig Dispense Refill Last Dose  . Omega-3 Fatty Acids (FISH OIL) 1200 MG CAPS Take by mouth.   Taking  . triamcinolone cream (KENALOG) 0.5 % Apply 1 application topically 2 (two) times daily. 30 g 0 Taking   No Known Allergies Past Medical History:  Diagnosis Date  . Chicken pox   . Colon polyp 07/27/2011  . Depression   . Obesity   . Sleep apnea 2008   CPAP   Past Surgical History:  Procedure Laterality Date  . COLONOSCOPY  07/27/2011   Dr David Hansen  . KNEE ARTHROSCOPY W/ ACL RECONSTRUCTION AND PATELLA GRAFT Right 1998   David Hansen in [place]  Right knee   Social History   Socioeconomic History  . Marital status: Married    Spouse name: Not on file  . Number of children: 2  . Years of education: Not on file  . Highest education level: Not on file  Occupational History  . Occupation: Land  Social Needs  . Financial resource strain: Not on file  . Food insecurity:    Worry: Not on file    Inability: Not on file  . Transportation needs:    Medical: Not on file    Non-medical: Not on file  Tobacco Use  . Smoking status: Never Smoker  . Smokeless tobacco: Never Used  Substance and Sexual Activity  . Alcohol use: Yes    Alcohol/week: 0.0 standard drinks    Comment: rarely  . Drug use: No  . Sexual activity: Not on file  Lifestyle  . Physical activity:    Days per week: Not on file    Minutes per session: Not on file  . Stress: Not on file  Relationships  . Social connections:    Talks on phone: Not on file    Gets together: Not on file    Attends religious service: Not on file    Active member of club or organization: Not on file    Attends meetings of clubs or organizations: Not on file   Relationship status: Not on file  . Intimate partner violence:    Fear of current or ex partner: Not on file    Emotionally abused: Not on file    Physically abused: Not on file    Forced sexual activity: Not on file  Other Topics Concern  . Not on file  Social History Narrative   Regular exercise yes 2-3 times a week...walking      Diet: fruits and veggies,lean protein, water   Social History   Social History Narrative   Regular exercise yes 2-3 times a week...walking      Diet: fruits and veggies,lean protein, water     ROS: Negative.     PE: HEENT: Negative. Lungs: Clear. Cardio: RR.  Assessment/Hansen:  Proceed with planned endoscopy. David Hansen [facility] 07/24/2018

## 2018-07-24 NOTE — Anesthesia Post-op Follow-up Note (Signed)
Anesthesia QCDR form completed.        

## 2018-07-24 NOTE — Anesthesia Preprocedure Evaluation (Signed)
Anesthesia Evaluation  Patient identified by MRN, date of birth, ID band Patient awake    Reviewed: Allergy & Precautions, H&P , NPO status , Patient's Chart, lab work & pertinent test results  Airway Mallampati: II  TM Distance: >3 FB Neck ROM: full    Dental  (+) Teeth Intact   Pulmonary neg pulmonary ROS, sleep apnea and Continuous Positive Airway Pressure Ventilation ,           Cardiovascular negative cardio ROS       Neuro/Psych PSYCHIATRIC DISORDERS Depression negative neurological ROS  negative psych ROS   GI/Hepatic negative GI ROS, Neg liver ROS,   Endo/Other  negative endocrine ROSMorbid obesity  Renal/GU negative Renal ROS  negative genitourinary   Musculoskeletal  (+) Arthritis ,   Abdominal   Peds  Hematology negative hematology ROS (+)   Anesthesia Other Findings Past Medical History: No date: Chicken pox 07/27/2011: Colon polyp No date: Depression No date: Obesity 2008: Sleep apnea     Comment:  CPAP  Past Surgical History: 07/27/2011: COLONOSCOPY     Comment:  Dr Fuller Plan 1998: KNEE ARTHROSCOPY W/ ACL RECONSTRUCTION AND PATELLA GRAFT; Right     Comment:  [facility] in [place]  Right knee  BMI    Body Mass Index:  44.08 kg/m      Reproductive/Obstetrics negative OB ROS                             Anesthesia Physical Anesthesia Plan  ASA: III  Anesthesia Plan: General   Post-op Pain Management:    Induction:   PONV Risk Score and Plan: Propofol infusion  Airway Management Planned:   Additional Equipment:   Intra-op Plan:   Post-operative Plan:   Informed Consent: I have reviewed the patients History and Physical, chart, labs and discussed the procedure including the risks, benefits and alternatives for the proposed anesthesia with the patient or authorized representative who has indicated his/her understanding and acceptance.   Dental  Advisory Given  Plan Discussed with: Anesthesiologist, CRNA and Surgeon  Anesthesia Plan Comments:         Anesthesia Quick Evaluation

## 2018-07-25 ENCOUNTER — Encounter: Payer: Self-pay | Admitting: General Surgery

## 2018-07-25 ENCOUNTER — Other Ambulatory Visit (INDEPENDENT_AMBULATORY_CARE_PROVIDER_SITE_OTHER): Payer: Managed Care, Other (non HMO)

## 2018-07-25 DIAGNOSIS — R7989 Other specified abnormal findings of blood chemistry: Secondary | ICD-10-CM | POA: Diagnosis not present

## 2018-07-25 LAB — TSH: TSH: 3.7 u[IU]/mL (ref 0.35–4.50)

## 2018-07-28 LAB — SURGICAL PATHOLOGY

## 2018-07-29 ENCOUNTER — Encounter: Payer: Self-pay | Admitting: *Deleted

## 2018-07-31 ENCOUNTER — Telehealth: Payer: Self-pay

## 2018-07-31 NOTE — Telephone Encounter (Signed)
-----   Message from Robert Bellow, MD sent at 07/30/2018  9:33 PM EDT -----  Please notify the patient that the polyps were benign, but one warrants a 3-year follow-up colonoscopy.  Please place and recalls. ----- Message ----- From: Interface, Lab In Three Zero One Sent: 07/28/2018   4:42 PM EDT To: Robert Bellow, MD

## 2018-07-31 NOTE — Telephone Encounter (Signed)
Patient has called back. Results from the colonoscopy performed on 07/24/18 by Dr Bary Castilla have been discussed with the patient. Patient informed that the polyps were benign and a 3 year repeat colonoscopy is recommended. Patient was ok with results and understand the recommendation for the follow-up colonoscopy. Patient informed that we will contact him at that time to schedule the colonoscopy.

## 2018-10-14 ENCOUNTER — Ambulatory Visit: Payer: Managed Care, Other (non HMO) | Admitting: Family Medicine

## 2018-10-14 ENCOUNTER — Encounter: Payer: Self-pay | Admitting: Family Medicine

## 2018-10-14 VITALS — BP 138/84 | HR 72 | Temp 98.6°F | Ht 72.0 in | Wt 334.4 lb

## 2018-10-14 DIAGNOSIS — J069 Acute upper respiratory infection, unspecified: Secondary | ICD-10-CM

## 2018-10-14 DIAGNOSIS — R6889 Other general symptoms and signs: Secondary | ICD-10-CM | POA: Diagnosis not present

## 2018-10-14 LAB — POC INFLUENZA A&B (BINAX/QUICKVUE)
Influenza A, POC: NEGATIVE
Influenza B, POC: NEGATIVE

## 2018-10-14 NOTE — Assessment & Plan Note (Signed)
Symptoms most consistent with viral upper respiratory infection.  Rapid flu test negative.  Treat supportively with Flonase, Zyrtec, and Mucinex.  Patient can consider using pseudoephedrine for short course if desired.  Discussed possible tachycardia and blood pressure elevation with this.  Given return precautions.

## 2018-10-14 NOTE — Progress Notes (Signed)
  Tommi Rumps, MD Phone: [phone]  David Hansen is a 47 y.o. male who presents today for same day visit.   CC: congestions  Patient notes symptoms started 2 days ago.  Started with sore scratchy throat and progressively developed nasal and sinus congestion and pressure.  Cough started last night.  Symptoms were not sudden onset.  He has had no fevers though has felt some chills.  Some slight muscle aches though no significant body aches.  No vomiting or diarrhea.  Blowing yellowish mucus out of his nose.  His ears do feel stopped up.  He has felt tired.  He has been traveling.  He has tried Alka-Seltzer over-the-counter.  Social History   Tobacco Use  Smoking Status Never Smoker  Smokeless Tobacco Never Used     ROS see history of present illness  Objective  Physical Exam Vitals:   10/14/18 1527  BP: 138/84  Pulse: 72  Temp: 98.6 F (37 C)  SpO2: 96%    BP Readings from Last 3 Encounters:  10/14/18 138/84  07/24/18 100/64  07/11/18 132/70   Wt Readings from Last 3 Encounters:  10/14/18 (!) 334 lb 6.4 oz (151.7 kg)  07/24/18 (!) 325 lb (147.4 kg)  07/11/18 (!) 327 lb (148.3 kg)    Physical Exam  Constitutional: No distress.  HENT:  Head: Normocephalic and atraumatic.  Mouth/Throat: Oropharynx is clear and moist.  Bilateral TMs with mild erythema though no purulence or bulging, light reflex intact  Eyes: Pupils are equal, round, and reactive to light. Conjunctivae are normal.  Neck: Neck supple.  Cardiovascular: Normal rate, regular rhythm and normal heart sounds.  Pulmonary/Chest: Effort normal and breath sounds normal.  Musculoskeletal: He exhibits no edema.  Lymphadenopathy:    He has no cervical adenopathy.  Neurological: He is alert.  Skin: Skin is warm and dry. He is not diaphoretic.     Assessment/Plan: Please see individual problem list.  URI (upper respiratory infection) Symptoms most consistent with viral upper respiratory  infection.  Rapid flu test negative.  Treat supportively with Flonase, Zyrtec, and Mucinex.  Patient can consider using pseudoephedrine for short course if desired.  Discussed possible tachycardia and blood pressure elevation with this.  Given return precautions.   Orders Placed This Encounter  Procedures  . POC Influenza A&B(BINAX/QUICKVUE)    No orders of the defined types were placed in this encounter.    Tommi Rumps, MD [place]

## 2018-10-14 NOTE — Patient Instructions (Signed)
Nice to see you. You likely have a viral upper respiratory infection. Treatment is symptomatic.  You can try over-the-counter Flonase, Mucinex, and/or Zyrtec.  Pseudoephedrine is a decongestant that could used to help symptomatically though should not be used for more than 1 to 2 days.  There is risk of blood pressure elevation and increased heart rate with this. If you are not improving after 7 to 10 days please be reevaluated. If you develop cough productive of blood or any new symptoms please be reevaluated.

## 2018-10-16 NOTE — Progress Notes (Signed)
*   [facility]     Assessment and Plan:  Obstructive sleep apnea. -Doing well on CPAP with resolution of apneas. - Continue nightly use of CPAP.  Return in about 1 year (around 10/18/2019).   Date: 10/16/2018  MRN# [id number] David Hansen [birth year]   David Hansen is a 47 y.o. old male seen in follow up for chief complaint of  Chief Complaint  Patient presents with  . Sleep Apnea    pt states cpap therapy is going well.     HPI:  The patient is a 47 year old male with a history of obesity, sleep apnea.  At last visit he was having some worsening daytime sleepiness and noticed excess mass leaks.  He was referred for a new mask to a new DME provider. Since his last visit he feels that he has been sleeping well, he is not sleepy during the day.   **CPAP download 09/16/2018-10/15/2018>> raw data personally reviewed.  Usage is 29/30 days.  Average usage on days used is 6 hours 20 minutes.  Set pressure of 5-20.  Median pressure is 8, 95th percentile pressure 10, maximum pressure 11, leaks are within normal limits.  Residual AHI is 0.9.  Overall this shows very good compliance with CPAP with excellent control of obstructive sleep apnea.  Medication:    Current Outpatient Medications:  .  Omega-3 Fatty Acids (FISH OIL) 1200 MG CAPS, Take by mouth., Disp: , Rfl:  .  triamcinolone cream (KENALOG) 0.5 %, Apply 1 application topically 2 (two) times daily., Disp: 30 g, Rfl: 0   Allergies:  Patient has no known allergies.     LABORATORY PANEL:   CBC No results for input(s): WBC, HGB, HCT, PLT in the last 168 hours. ------------------------------------------------------------------------------------------------------------------  Chemistries  No results for input(s): NA, K, CL, CO2, GLUCOSE, BUN, CREATININE, CALCIUM, MG, AST, ALT, ALKPHOS, BILITOT in the last 168 hours.  Invalid input(s):  GFRCGP ------------------------------------------------------------------------------------------------------------------  Cardiac Enzymes No results for input(s): TROPONINI in the last 168 hours. ------------------------------------------------------------  RADIOLOGY:    Results for orders placed in visit on 10/07/10  DG Chest 2 View   Narrative Clinical Data: Pleuritic chest pain.   CHEST - 2 VIEW   Comparison: None.   Findings: The heart size is normal.  The lung volumes are low.  The lungs are clear.  The visualized soft tissues and bony thorax are unremarkable.   IMPRESSION: Negative chest.  Provider: Selinda Orion   ------------------------------------------------------------------------------------------------------------------  Thank  you for allowing [facility] to assist in the care of your patient. Our recommendations are noted above.  Please contact us if we can be of further service.  Marda Stalker, M.D., F.C.C.P.  Board Certified in Internal Medicine, Pulmonary Medicine, [place], and Sleep Medicine.  [facility] Office Number: [phone]  10/16/2018

## 2018-10-17 ENCOUNTER — Ambulatory Visit: Payer: Managed Care, Other (non HMO) | Admitting: Internal Medicine

## 2018-10-17 ENCOUNTER — Encounter: Payer: Self-pay | Admitting: Internal Medicine

## 2018-10-17 VITALS — BP 118/86 | HR 72 | Resp 16 | Ht 72.0 in | Wt 337.0 lb

## 2018-10-17 DIAGNOSIS — G4733 Obstructive sleep apnea (adult) (pediatric): Secondary | ICD-10-CM | POA: Diagnosis not present

## 2018-10-17 NOTE — Addendum Note (Signed)
Addended by: Stephanie Coup on: 10/17/2018 12:03 PM   Modules accepted: Orders

## 2018-10-17 NOTE — Patient Instructions (Signed)
Continue to use CPAP every night.  

## 2019-01-07 ENCOUNTER — Encounter: Payer: Self-pay | Admitting: Primary Care

## 2019-01-09 ENCOUNTER — Ambulatory Visit (INDEPENDENT_AMBULATORY_CARE_PROVIDER_SITE_OTHER)
Admission: RE | Admit: 2019-01-09 | Discharge: 2019-01-09 | Disposition: A | Discharge status: Home / Self Care | Payer: PRIVATE HEALTH INSURANCE | Source: Ambulatory Visit | Attending: Primary Care | Admitting: Primary Care

## 2019-01-09 ENCOUNTER — Encounter: Payer: Self-pay | Admitting: Primary Care

## 2019-01-09 ENCOUNTER — Ambulatory Visit: Payer: PRIVATE HEALTH INSURANCE | Admitting: Primary Care

## 2019-01-09 DIAGNOSIS — M542 Cervicalgia: Secondary | ICD-10-CM

## 2019-01-09 DIAGNOSIS — M545 Low back pain, unspecified: Secondary | ICD-10-CM

## 2019-01-09 MED ORDER — CYCLOBENZAPRINE HCL 5 MG PO TABS: 5.0000 mg | ORAL_TABLET | Freq: Three times a day (TID) | ORAL | 0 refills | Status: DC | PRN

## 2019-01-09 NOTE — Patient Instructions (Signed)
Complete xray(s) prior to leaving today. I will notify you of your results once received.  You can take 1-2 of the cyclobenzaprine tablets up to three times daily as needed. This is the muscle relaxer. Caution as this may cause drowsiness.  Continue to apply heat, try to stretch the neck and lower back.  Please update if no improvement in 2-3 weeks.  It was a pleasure to see you today!

## 2019-01-09 NOTE — Assessment & Plan Note (Signed)
Tenderness on exam over MSK region, no bony tenderness. Appears that he's had this issue in the past. Will check plain films of cervical spine to ensure no abnormality. Will treat for MSK cause including strain vs spasm. Rx for Flexeril sent to pharmacy. Continue Advil and heat. Discussed neck exercises. Follow up PRN.

## 2019-01-09 NOTE — Progress Notes (Signed)
Subjective:    Patient ID: David Hansen, male    DOB: 1971/06/29, 47 y.o.   MRN: [id number]  HPI  David Hansen is a 48 year old male who presents today with a chief complaint of neck and back pain.  He was evaluated at [facility] several weeks ago for acute neck pain with headaches. Was noted to have an ear infection to the left ear and was treated with Augmentin. He completed the course of antibiotics on January 19th. He was told that his neck pain was secondary to the ear infection.  After his visit to [facility] his neck pain and headaches resolved, returned about one week ago. His neck pain feels like he pulled muscle. Increased pain with range of motion movement. Describes his discomfort as sore/tender/tight. Mostly located to the right posterior neck, some movement to mid and left side. He's applied heat and taken Advil with temporary improvement.   He also reports lower back pain bilaterally which began several days ago. He is mostly sedentary at work, tries to get up to stretch. He denies radiation of pain to lower extremities also denies numbness tingling to lower extremities. He does have numbness to bilateral hands sometimes when he wakes in the morning, this will go dissipate.   He has been under a lot of stress at work, his dentist thinks he's grinding his teeth at night.   Review of Systems  Musculoskeletal: Positive for back pain and neck pain.  Neurological: Positive for headaches. Negative for weakness and numbness.       Past Medical History:  Diagnosis Date  . Chicken pox   . Colon polyp 07/27/2011  . Depression   . Obesity   . Sleep apnea 2008   CPAP     Social History   Socioeconomic History  . Marital status: Married    Spouse name: Not on file  . Number of children: 2  . Years of education: Not on file  . Highest education level: Not on file  Occupational History  . Occupation: Land  Social Needs  . Financial resource strain: Not on  file  . Food insecurity:    Worry: Not on file    Inability: Not on file  . Transportation needs:    Medical: Not on file    Non-medical: Not on file  Tobacco Use  . Smoking status: Never Smoker  . Smokeless tobacco: Never Used  Substance and Sexual Activity  . Alcohol use: Yes    Alcohol/week: 0.0 standard drinks    Comment: rarely  . Drug use: No  . Sexual activity: Not on file  Lifestyle  . Physical activity:    Days per week: Not on file    Minutes per session: Not on file  . Stress: Not on file  Relationships  . Social connections:    Talks on phone: Not on file    Gets together: Not on file    Attends religious service: Not on file    Active member of club or organization: Not on file    Attends meetings of clubs or organizations: Not on file    Relationship status: Not on file  . Intimate partner violence:    Fear of current or ex partner: Not on file    Emotionally abused: Not on file    Physically abused: Not on file    Forced sexual activity: Not on file  Other Topics Concern  . Not on file  Social History  Narrative   Regular exercise yes 2-3 times a week...walking      Diet: fruits and veggies,lean protein, water    Past Surgical History:  Procedure Laterality Date  . COLONOSCOPY  07/27/2011   Dr Fuller Plan  . COLONOSCOPY WITH PROPOFOL N/A 07/24/2018   Procedure: COLONOSCOPY WITH PROPOFOL;  Surgeon: Robert Bellow, MD;  Location: [facility] ENDOSCOPY;  Service: Endoscopy;  Laterality: N/A;  . KNEE ARTHROSCOPY W/ ACL RECONSTRUCTION AND PATELLA GRAFT Right 1998   [facility] in [place]  Right knee    Family History  Problem Relation Age of Onset  . Cancer Father 29       colon cancer    No Known Allergies  Current Outpatient Medications on File Prior to Visit  Medication Sig Dispense Refill  . Omega-3 Fatty Acids (FISH OIL) 1200 MG CAPS Take by mouth.    . triamcinolone cream (KENALOG) 0.5 % Apply 1 application topically 2 (two) times daily. 30 g 0     No current facility-administered medications on file prior to visit.     BP 136/86   Pulse 78   Temp 98.2 F (36.8 C) (Oral)   Ht 6' (1.829 m)   Wt (!) 342 lb 8 oz (155.4 kg)   SpO2 98%   BMI 46.45 kg/m    Objective:   Physical Exam  Constitutional: He appears well-nourished.  Neck: Neck supple. Muscular tenderness present. No spinous process tenderness present.    Pain with ROM with right and left rotation, and flexion.  Cardiovascular: Normal rate.  Respiratory: Effort normal.  Musculoskeletal:     Lumbar back: He exhibits pain. He exhibits normal range of motion and no tenderness.       Back:     Comments: 5/5 strength to bilateral lower extremities. Ambulatory without difficulty.  Skin: Skin is warm and dry.           Assessment & Plan:

## 2019-01-09 NOTE — Assessment & Plan Note (Signed)
Likely MSK cause.  No alarm signs.  Heat, ice, stretching, Advil, Flexeril. He will update.

## 2019-01-20 ENCOUNTER — Telehealth: Payer: Self-pay

## 2019-01-20 NOTE — Telephone Encounter (Signed)
Noted. Will address at upcoming visit.  

## 2019-01-20 NOTE — Telephone Encounter (Signed)
Pt last seen 01/09/19;pt said neck and lower back pain better taking cyclobenzaprine and using heat but pain has not completely gone away. Starting 01/20/19 pt noticed numbness in front of lt leg about mid thigh. Pt scheduled appt to be seen on 01/21/19 at 9 AM.Please advise.[facility]

## 2019-01-21 ENCOUNTER — Ambulatory Visit (INDEPENDENT_AMBULATORY_CARE_PROVIDER_SITE_OTHER): Payer: PRIVATE HEALTH INSURANCE | Admitting: Primary Care

## 2019-01-21 VITALS — BP 134/84 | HR 72 | Temp 98.1°F | Ht 72.0 in | Wt 340.8 lb

## 2019-01-21 DIAGNOSIS — M542 Cervicalgia: Secondary | ICD-10-CM

## 2019-01-21 DIAGNOSIS — M545 Low back pain, unspecified: Secondary | ICD-10-CM

## 2019-01-21 MED ORDER — PREDNISONE 20 MG PO TABS: ORAL_TABLET | ORAL | 0 refills | Status: DC

## 2019-01-21 NOTE — Patient Instructions (Signed)
Start prednisone 20 mg tablets. Take 3 tablets for two days, then 2 tablets for two days, then 1 tablet for two days.  Continue the cyclobenzaprine (Flexeril) as needed for muscle tightness/spasm.   Continue stretching exercises, heat, topical agents as discussed.   Please notify me if no improvement in one week.  It was a pleasure to see you today!

## 2019-01-21 NOTE — Assessment & Plan Note (Signed)
Overall improved. Xray's with disc space narrowing and stable old fracture, otherwise unremarkable. No radiculopathy so we will hold off on MRI.  Continue current regimen. He will update if symptoms become worse.

## 2019-01-21 NOTE — Assessment & Plan Note (Signed)
Increased since last visit, now with numbness/tingling to left lower extremity. Exam today overall stable, negative straight leg raise, no alarm signs. Treat with oral prednisone x 6 days. Continue Flexeril PRN, stretching/heat/topical agents. Consider plain films of lumbar spine, PT, vs ortho eval if symptoms persist.

## 2019-01-21 NOTE — Progress Notes (Signed)
Subjective:    Patient ID: David Hansen, male    DOB: 08/25/1971, 48 y.o.   MRN: [id number]  HPI  David Hansen is a 48 year old male who presents today with a chief complaint of neck and back pain.  He was last evaluated on 01/09/19 with a chief complaint of neck and back pain that began in early to mid January 2020. He was initially evaluated at [facility] and treated for "ear infection" with an Augmentin course. His neck and back pain persisted after treatment.  During his visit several weeks ago he endorsed increased pain to neck with ROM, mostly to right lateral neck, felt like a "pulled muscle". Bilateral lower back pain began several days prior. He denied numbness/tingling to upper and lower extremities. He was treated with Flexeril, Advil, heat, neck exercises and asked to return if no improvement. Cervical spine xray with disc space narrowing, old fracture that was unchanged.   Since his last visit he continues to experience right lateral neck pain/tightness. He's using the muscle relaxer and biofreeze with some improvement. He denies numbness/tingling to the upper extremities. Overall his neck pain has improved.   He continues to have left lower back pain with radiation down to his left buttocks. Yesterday he began to experience numbness/tingling to the left lower extremity. He's been using [employer], Biofreeze, and Flexeril with temporary improvement. He denies weakness, changes in bowel/blader control. He is sedentary at work, is trying to get up once hourly, also stretching twice daily.   Review of Systems  Genitourinary:       Denies changes in bowel/bladder control.  Musculoskeletal: Positive for back pain, neck pain and neck stiffness.  Skin: Negative for color change.  Neurological: Positive for numbness.       Past Medical History:  Diagnosis Date  . Chicken pox   . Colon polyp 07/27/2011  . Depression   . Obesity   . Sleep apnea 2008   CPAP       Social History   Socioeconomic History  . Marital status: Married    Spouse name: Not on file  . Number of children: 2  . Years of education: Not on file  . Highest education level: Not on file  Occupational History  . Occupation: Land  Social Needs  . Financial resource strain: Not on file  . Food insecurity:    Worry: Not on file    Inability: Not on file  . Transportation needs:    Medical: Not on file    Non-medical: Not on file  Tobacco Use  . Smoking status: Never Smoker  . Smokeless tobacco: Never Used  Substance and Sexual Activity  . Alcohol use: Yes    Alcohol/week: 0.0 standard drinks    Comment: rarely  . Drug use: No  . Sexual activity: Not on file  Lifestyle  . Physical activity:    Days per week: Not on file    Minutes per session: Not on file  . Stress: Not on file  Relationships  . Social connections:    Talks on phone: Not on file    Gets together: Not on file    Attends religious service: Not on file    Active member of club or organization: Not on file    Attends meetings of clubs or organizations: Not on file    Relationship status: Not on file  . Intimate partner violence:    Fear of current or ex partner:  Not on file    Emotionally abused: Not on file    Physically abused: Not on file    Forced sexual activity: Not on file  Other Topics Concern  . Not on file  Social History Narrative   Regular exercise yes 2-3 times a week...walking      Diet: fruits and veggies,lean protein, water    Past Surgical History:  Procedure Laterality Date  . COLONOSCOPY  07/27/2011   Dr Fuller Plan  . COLONOSCOPY WITH PROPOFOL N/A 07/24/2018   Procedure: COLONOSCOPY WITH PROPOFOL;  Surgeon: Robert Bellow, MD;  Location: [facility] ENDOSCOPY;  Service: Endoscopy;  Laterality: N/A;  . KNEE ARTHROSCOPY W/ ACL RECONSTRUCTION AND PATELLA GRAFT Right 1998   [facility] in [place]  Right knee    Family History  Problem Relation Age of Onset  .  Cancer Father 4       colon cancer    No Known Allergies  Current Outpatient Medications on File Prior to Visit  Medication Sig Dispense Refill  . cyclobenzaprine (FLEXERIL) 5 MG tablet Take 1-2 tablets (5-10 mg total) by mouth 3 (three) times daily as needed for muscle spasms. 30 tablet 0  . Omega-3 Fatty Acids (FISH OIL) 1200 MG CAPS Take by mouth.    . triamcinolone cream (KENALOG) 0.5 % Apply 1 application topically 2 (two) times daily. 30 g 0   No current facility-administered medications on file prior to visit.     BP 134/84   Pulse 72   Temp 98.1 F (36.7 C) (Oral)   Ht 6' (1.829 m)   Wt (!) 340 lb 12 oz (154.6 kg)   SpO2 98%   BMI 46.21 kg/m    Objective:   Physical Exam  Constitutional: He appears well-nourished.  Neck: Neck supple. No spinous process tenderness and no muscular tenderness present. Decreased range of motion present.    Improved ROM when compared to last visit, slight decrease in ROM with right lateral rotation.  Cardiovascular: Normal rate.  Respiratory: Effort normal.  Musculoskeletal:     Lumbar back: He exhibits pain. He exhibits normal range of motion, no tenderness and no bony tenderness.       Back:     Comments: Negative straight leg raise bilaterally. 5/5 strength bilaterally.   Skin: Skin is warm and dry.           Assessment & Plan:

## 2019-01-22 MED ORDER — CYCLOBENZAPRINE HCL 5 MG PO TABS: 5.0000 mg | ORAL_TABLET | Freq: Three times a day (TID) | ORAL | 0 refills | Status: DC | PRN

## 2019-10-03 ENCOUNTER — Telehealth: Payer: Self-pay | Admitting: Primary Care

## 2019-10-03 NOTE — Telephone Encounter (Signed)
Pt dropped off Proof of Annual Physical form to be filled out. I let him know his provider is out of the office today and he said someone told him over the phone that another provider might fill this out for him? I told him I was unsure but we will call when the form is completed. Placed form in [place] for [employer].  CB [phone]

## 2019-10-06 NOTE — Telephone Encounter (Signed)
Per DPR, left detail message of [name] comments for patient to call back. We have on file that last CPE was 06/12/2018. Need to know if we could use this date or does he need to schedule a CPE for this year.

## 2019-10-09 ENCOUNTER — Ambulatory Visit (INDEPENDENT_AMBULATORY_CARE_PROVIDER_SITE_OTHER): Payer: PRIVATE HEALTH INSURANCE

## 2019-10-09 DIAGNOSIS — Z23 Encounter for immunization: Secondary | ICD-10-CM | POA: Diagnosis not present

## 2019-10-22 NOTE — Telephone Encounter (Signed)
Per DPR, left detail message of [name] comments for patient to call back. We have on file that last CPE was 06/12/2018. Need to know if we could use this date or does he need to schedule a CPE for this year.

## 2020-05-04 ENCOUNTER — Other Ambulatory Visit: Payer: Self-pay

## 2020-05-04 ENCOUNTER — Encounter: Payer: Self-pay | Admitting: Primary Care

## 2020-05-04 ENCOUNTER — Other Ambulatory Visit: Payer: Self-pay | Admitting: Primary Care

## 2020-05-04 ENCOUNTER — Other Ambulatory Visit (INDEPENDENT_AMBULATORY_CARE_PROVIDER_SITE_OTHER): Payer: PRIVATE HEALTH INSURANCE

## 2020-05-04 DIAGNOSIS — R7989 Other specified abnormal findings of blood chemistry: Secondary | ICD-10-CM | POA: Diagnosis not present

## 2020-05-04 DIAGNOSIS — Z0000 Encounter for general adult medical examination without abnormal findings: Secondary | ICD-10-CM | POA: Diagnosis not present

## 2020-05-04 LAB — COMPREHENSIVE METABOLIC PANEL
ALT: 16 U/L (ref 0–53)
AST: 13 U/L (ref 0–37)
Albumin: 4.1 g/dL (ref 3.5–5.2)
Alkaline Phosphatase: 37 U/L — ABNORMAL LOW (ref 39–117)
BUN: 17 mg/dL (ref 6–23)
CO2: 30 mEq/L (ref 19–32)
Calcium: 8.9 mg/dL (ref 8.4–10.5)
Chloride: 104 mEq/L (ref 96–112)
Creatinine, Ser: 0.95 mg/dL (ref 0.40–1.50)
GFR: 84.3 mL/min (ref >60.00)
Glucose, Bld: 105 mg/dL — ABNORMAL HIGH (ref 70–99)
Potassium: 4.3 mEq/L (ref 3.5–5.1)
Sodium: 139 mEq/L (ref 135–145)
Total Bilirubin: 0.4 mg/dL (ref 0.2–1.2)
Total Protein: 6.4 g/dL (ref 6.0–8.3)

## 2020-05-04 LAB — TSH: TSH: 3.99 u[IU]/mL (ref 0.35–4.50)

## 2020-05-04 LAB — LIPID PANEL
Cholesterol: 179 mg/dL (ref 0–200)
HDL: 42.9 mg/dL (ref >39.00)
LDL Cholesterol: 123 mg/dL — ABNORMAL HIGH (ref 0–99)
NonHDL: 136
Total CHOL/HDL Ratio: 4
Triglycerides: 67 mg/dL (ref 0.0–149.0)
VLDL: 13.4 mg/dL (ref 0.0–40.0)

## 2020-05-04 LAB — CBC
HCT: 40.9 % (ref 39.0–52.0)
Hemoglobin: 13.7 g/dL (ref 13.0–17.0)
MCHC: 33.4 g/dL (ref 30.0–36.0)
MCV: 87.9 fl (ref 78.0–100.0)
Platelets: 239 10*3/uL (ref 150.0–400.0)
RBC: 4.65 Mil/uL (ref 4.22–5.81)
RDW: 13.8 % (ref 11.5–15.5)
WBC: 3.7 10*3/uL — ABNORMAL LOW (ref 4.0–10.5)

## 2020-05-11 ENCOUNTER — Ambulatory Visit (INDEPENDENT_AMBULATORY_CARE_PROVIDER_SITE_OTHER): Payer: PRIVATE HEALTH INSURANCE | Admitting: Primary Care

## 2020-05-11 ENCOUNTER — Other Ambulatory Visit: Payer: Self-pay

## 2020-05-11 ENCOUNTER — Encounter: Payer: Self-pay | Admitting: Primary Care

## 2020-05-11 VITALS — BP 130/78 | HR 82 | Temp 96.6°F | Ht 72.0 in | Wt 352.0 lb

## 2020-05-11 DIAGNOSIS — Z800 Family history of malignant neoplasm of digestive organs: Secondary | ICD-10-CM

## 2020-05-11 DIAGNOSIS — Z0000 Encounter for general adult medical examination without abnormal findings: Secondary | ICD-10-CM

## 2020-05-11 DIAGNOSIS — R5383 Other fatigue: Secondary | ICD-10-CM

## 2020-05-11 DIAGNOSIS — G4733 Obstructive sleep apnea (adult) (pediatric): Secondary | ICD-10-CM

## 2020-05-11 DIAGNOSIS — R739 Hyperglycemia, unspecified: Secondary | ICD-10-CM | POA: Diagnosis not present

## 2020-05-11 DIAGNOSIS — L301 Dyshidrosis [pompholyx]: Secondary | ICD-10-CM

## 2020-05-11 LAB — VITAMIN B12: Vitamin B-12: 207 pg/mL — ABNORMAL LOW (ref 211–911)

## 2020-05-11 LAB — HEMOGLOBIN A1C: Hgb A1c MFr Bld: 6.4 % (ref 4.6–6.5)

## 2020-05-11 LAB — TESTOSTERONE: Testosterone: 220.11 ng/dL — ABNORMAL LOW (ref 300.00–890.00)

## 2020-05-11 LAB — VITAMIN D 25 HYDROXY (VIT D DEFICIENCY, FRACTURES): VITD: 29.35 ng/mL — ABNORMAL LOW (ref 30.00–100.00)

## 2020-05-11 NOTE — Assessment & Plan Note (Signed)
Colonoscopy UTD, due again in 2024.

## 2020-05-11 NOTE — Assessment & Plan Note (Signed)
Compliant to CPAP machine nightly. 

## 2020-05-11 NOTE — Progress Notes (Signed)
Subjective:    Patient ID: David Hansen, male    DOB: 1971-05-16, 49 y.o.   MRN: [id number]  HPI  This visit occurred during the SARS-CoV-2 public health emergency.  Safety protocols were in place, including screening questions prior to the visit, additional usage of staff PPE, and extensive cleaning of exam room while observing appropriate contact time as indicated for disinfecting solutions.   David Hansen is a 49 year old male who presents today for complete physical. He would also like to discuss fatigue.   Chronic fatigue over the year, he's not sure if he has a leak in his CPAP machine or if he has a hormone imbalance. Some symptoms of little motivation, frustration with weight gain, lack of energy.   Immunizations: -Tetanus: Completed in 2016 -Influenza: Completed last season  -Covid-19: Completed series  Diet: He endorses a healthy diet.  Exercise: He is walking some, trying to stay active   Eye exam: Completed in 2021 Dental exam: Completes semi-annually   Colonoscopy: Completed in 2019, due again in 2024  BP Readings from Last 3 Encounters:  05/11/20 130/78  01/21/19 134/84  01/09/19 136/86   Wt Readings from Last 3 Encounters:  05/11/20 (!) 352 lb (159.7 kg)  01/21/19 (!) 340 lb 12 oz (154.6 kg)  01/09/19 (!) 342 lb 8 oz (155.4 kg)     Review of Systems  Constitutional: Positive for fatigue. Negative for unexpected weight change.  HENT: Negative for rhinorrhea.   Respiratory: Negative for cough and shortness of breath.   Cardiovascular: Negative for chest pain.  Gastrointestinal: Negative for constipation and diarrhea.  Genitourinary: Negative for difficulty urinating.  Musculoskeletal: Positive for arthralgias.       Chronic foot pain, knee pain.   Skin: Negative for rash.  Allergic/Immunologic: Negative for environmental allergies.  Neurological: Negative for dizziness, numbness and headaches.  Psychiatric/Behavioral: The patient is not  nervous/anxious.        Increased stress at work, fatigue, lack of energy and motivation.        Past Medical History:  Diagnosis Date  . Chicken pox   . Colon polyp 07/27/2011  . Depression   . Obesity   . Sleep apnea 2008   CPAP     Social History   Socioeconomic History  . Marital status: Married    Spouse name: Not on file  . Number of children: 2  . Years of education: Not on file  . Highest education level: Not on file  Occupational History  . Occupation: Land  Tobacco Use  . Smoking status: Never Smoker  . Smokeless tobacco: Never Used  Substance and Sexual Activity  . Alcohol use: Yes    Alcohol/week: 0.0 standard drinks    Comment: rarely  . Drug use: No  . Sexual activity: Not on file  Other Topics Concern  . Not on file  Social History Narrative   Regular exercise yes 2-3 times a week...walking      Diet: fruits and veggies,lean protein, water   Social Determinants of Health   Financial Resource Strain:   . Difficulty of Paying Living Expenses:   Food Insecurity:   . Worried About Charity fundraiser in the Last Year:   . Arboriculturist in the Last Year:   Transportation Needs:   . Film/video editor (Medical):   Marland Kitchen Lack of Transportation (Non-Medical):   Physical Activity:   . Days of Exercise per Week:   . Minutes of  Exercise per Session:   Stress:   . Feeling of Stress :   Social Connections:   . Frequency of Communication with Friends and Family:   . Frequency of Social Gatherings with Friends and Family:   . Attends Religious Services:   . Active Member of Clubs or Organizations:   . Attends Archivist Meetings:   Marland Kitchen Marital Status:   Intimate Partner Violence:   . Fear of Current or Ex-Partner:   . Emotionally Abused:   Marland Kitchen Physically Abused:   . Sexually Abused:     Past Surgical History:  Procedure Laterality Date  . COLONOSCOPY  07/27/2011   Dr Fuller Plan  . COLONOSCOPY WITH PROPOFOL N/A 07/24/2018    Procedure: COLONOSCOPY WITH PROPOFOL;  Surgeon: Robert Bellow, MD;  Location: [facility]COPY;  Service: Endoscopy;  Laterality: N/A;  . KNEE ARTHROSCOPY W/ ACL RECONSTRUCTION AND PATELLA GRAFT Right 1998   [facility] in [place]  Right knee    Family History  Problem Relation Age of Onset  . Cancer Father 27       colon cancer    No Known Allergies  No current outpatient medications on file prior to visit.   No current facility-administered medications on file prior to visit.    BP 130/78   Pulse 82   Temp (!) 96.6 F (35.9 C) (Temporal)   Ht 6' (1.829 m)   Wt (!) 352 lb (159.7 kg)   SpO2 98%   BMI 47.74 kg/m    Objective:   Physical Exam  Constitutional: He is oriented to person, place, and time. He appears well-nourished.  HENT:  Right Ear: Tympanic membrane and ear canal normal.  Left Ear: Tympanic membrane and ear canal normal.  Mouth/Throat: Oropharynx is clear and moist.  Eyes: Pupils are equal, round, and reactive to light. EOM are normal.  Cardiovascular: Normal rate and regular rhythm.  Respiratory: Effort normal and breath sounds normal.  GI: Soft. Bowel sounds are normal. There is no abdominal tenderness.  Musculoskeletal:        General: Normal range of motion.     Cervical back: Neck supple.  Neurological: He is alert and oriented to person, place, and time. No cranial nerve deficit.  Reflex Scores:      Patellar reflexes are 2+ on the right side and 2+ on the left side. Skin: Skin is warm and dry.  Psychiatric: He has a normal mood and affect.           Assessment & Plan:

## 2020-05-11 NOTE — Patient Instructions (Addendum)
Stop by the lab prior to leaving today. I will notify you of your results once received.   Continue exercising. You should be getting 150 minutes of moderate intensity exercise weekly.  Continue to work on a healthy diet. Ensure you are consuming 64 ounces of water daily.  It was a pleasure to see you today!   Preventive Care 55-49 Years Old, Male Preventive care refers to lifestyle choices and visits with your health care provider that can promote health and wellness. This includes:  A yearly physical exam. This is also called an annual well check.  Regular dental and eye exams.  Immunizations.  Screening for certain conditions.  Healthy lifestyle choices, such as eating a healthy diet, getting regular exercise, not using drugs or products that contain nicotine and tobacco, and limiting alcohol use. What can I expect for my preventive care visit? Physical exam Your health care provider will check:  Height and weight. These may be used to calculate body mass index (BMI), which is a measurement that tells if you are at a healthy weight.  Heart rate and blood pressure.  Your skin for abnormal spots. Counseling Your health care provider may ask you questions about:  Alcohol, tobacco, and drug use.  Emotional well-being.  Home and relationship well-being.  Sexual activity.  Eating habits.  Work and work Statistician. What immunizations do I need?  Influenza (flu) vaccine  This is recommended every year. Tetanus, diphtheria, and pertussis (Tdap) vaccine  You may need a Td booster every 10 years. Varicella (chickenpox) vaccine  You may need this vaccine if you have not already been vaccinated. Zoster (shingles) vaccine  You may need this after age 49. Measles, mumps, and rubella (MMR) vaccine  You may need at least one dose of MMR if you were born in 1957 or later. You may also need a second dose. Pneumococcal conjugate (PCV13) vaccine  You may need this if you  have certain conditions and were not previously vaccinated. Pneumococcal polysaccharide (PPSV23) vaccine  You may need one or two doses if you smoke cigarettes or if you have certain conditions. Meningococcal conjugate (MenACWY) vaccine  You may need this if you have certain conditions. Hepatitis A vaccine  You may need this if you have certain conditions or if you travel or work in places where you may be exposed to hepatitis A. Hepatitis B vaccine  You may need this if you have certain conditions or if you travel or work in places where you may be exposed to hepatitis B. Haemophilus influenzae type b (Hib) vaccine  You may need this if you have certain risk factors. Human papillomavirus (HPV) vaccine  If recommended by your health care provider, you may need three doses over 6 months. You may receive vaccines as individual doses or as more than one vaccine together in one shot (combination vaccines). Talk with your health care provider about the risks and benefits of combination vaccines. What tests do I need? Blood tests  Lipid and cholesterol levels. These may be checked every 5 years, or more frequently if you are over 26 years old.  Hepatitis C test.  Hepatitis B test. Screening  Lung cancer screening. You may have this screening every year starting at age 49 if you have a 30-pack-year history of smoking and currently smoke or have quit within the past 15 years.  Prostate cancer screening. Recommendations will vary depending on your family history and other risks.  Colorectal cancer screening. All adults should have this  screening starting at age 49 and continuing until age 76. Your health care provider may recommend screening at age 49 if you are at increased risk. You will have tests every 1-10 years, depending on your results and the type of screening test.  Diabetes screening. This is done by checking your blood sugar (glucose) after you have not eaten for a while  (fasting). You may have this done every 1-3 years.  Sexually transmitted disease (STD) testing. Follow these instructions at home: Eating and drinking  Eat a diet that includes fresh fruits and vegetables, whole grains, lean protein, and low-fat dairy products.  Take vitamin and mineral supplements as recommended by your health care provider.  Do not drink alcohol if your health care provider tells you not to drink.  If you drink alcohol: ? Limit how much you have to 0-2 drinks a day. ? Be aware of how much alcohol is in your drink. In the [place] one drink equals one 12 oz bottle of beer (355 mL), one 5 oz glass of wine (148 mL), or one 1 oz glass of hard liquor (44 mL). Lifestyle  Take daily care of your teeth and gums.  Stay active. Exercise for at least 30 minutes on 5 or more days each week.  Do not use any products that contain nicotine or tobacco, such as cigarettes, e-cigarettes, and chewing tobacco. If you need help quitting, ask your health care provider.  If you are sexually active, practice safe sex. Use a condom or other form of protection to prevent STIs (sexually transmitted infections).  Talk with your health care provider about taking a low-dose aspirin every day starting at age 49. What's next?  Go to your health care provider once a year for a well check visit.  Ask your health care provider how often you should have your eyes and teeth checked.  Stay up to date on all vaccines. This information is not intended to replace advice given to you by your health care provider. Make sure you discuss any questions you have with your health care provider. Document Revised: 11/14/2018 Document Reviewed: 11/14/2018 Elsevier Patient Education  2020 [employer].

## 2020-05-11 NOTE — Assessment & Plan Note (Signed)
Noted again on fasting labs, A1C pending.

## 2020-05-11 NOTE — Assessment & Plan Note (Signed)
Chronic over the last year, also with little motivation, lack of energy.  Discussed to work on increasing exercise. Checking additional labs today including testosterone, B12, Vitamin D, A1C.   Also consider depression diagnosis as he has a history of this in prior years. Await lab results.

## 2020-05-11 NOTE — Assessment & Plan Note (Signed)
Immunizations UTD. Colonoscopy UTD, due in 2024. Strongly advised to increase exercise, work on a healthy diet.  Exam today unremarkable. Labs reviewed, additional labs pending.

## 2020-05-11 NOTE — Assessment & Plan Note (Signed)
Well controlled during Summer months, mostly struggles during winter months. Occasionally needs triamcinolone 0.5% ointment.

## 2020-05-18 DIAGNOSIS — G8929 Other chronic pain: Secondary | ICD-10-CM

## 2020-06-15 ENCOUNTER — Encounter: Payer: Self-pay | Admitting: Podiatry

## 2020-06-15 ENCOUNTER — Ambulatory Visit (INDEPENDENT_AMBULATORY_CARE_PROVIDER_SITE_OTHER): Payer: PRIVATE HEALTH INSURANCE

## 2020-06-15 ENCOUNTER — Other Ambulatory Visit: Payer: Self-pay | Admitting: Podiatry

## 2020-06-15 ENCOUNTER — Other Ambulatory Visit: Payer: Self-pay

## 2020-06-15 ENCOUNTER — Ambulatory Visit (INDEPENDENT_AMBULATORY_CARE_PROVIDER_SITE_OTHER): Payer: PRIVATE HEALTH INSURANCE | Admitting: Podiatry

## 2020-06-15 DIAGNOSIS — M7671 Peroneal tendinitis, right leg: Secondary | ICD-10-CM

## 2020-06-15 DIAGNOSIS — M779 Enthesopathy, unspecified: Secondary | ICD-10-CM

## 2020-06-15 MED ORDER — MELOXICAM 15 MG PO TABS
15.0000 mg | ORAL_TABLET | Freq: Every day | ORAL | 0 refills | Status: DC
Start: 2020-06-15 — End: 2021-02-21

## 2020-06-15 NOTE — Progress Notes (Signed)
Subjective:  Patient ID: David Hansen, male    DOB: 09-09-1971,  MRN: [id number]  Chief Complaint  Patient presents with  . Foot Pain    Patient presents today for right foot pain at 5th met back to heel/ankle x 6 weeks    49 y.o. male presents with the above complaint.  Patient presents with a complaint of right fifth metatarsal base pain as well as pain along the course of peroneal tendon at the insertion of the fifth metatarsal base.  Patient states that his been painful for about 6 weeks has progressive gotten worse.  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  Patient states he just got orthotics redone 2 weeks ago which seems to be helping pretty well.  He denies any other acute complaints.  He states the pain scale is 7 out of 10.  He is constantly on his foot.   Review of Systems: Negative except as noted in the HPI. Denies N/V/F/Ch.  Past Medical History:  Diagnosis Date  . Chicken pox   . Colon polyp 07/27/2011  . Depression   . Obesity   . Sleep apnea 2008   CPAP    Current Outpatient Medications:  .  cholecalciferol (VITAMIN D3) 25 MCG (1000 UNIT) tablet, Take 1,000 Units by mouth daily., Disp: , Rfl:  .  magnesium 30 MG tablet, Take 30 mg by mouth 2 (two) times daily., Disp: , Rfl:  .  vitamin B-12 (CYANOCOBALAMIN) 1000 MCG tablet, Take 1,000 mcg by mouth daily., Disp: , Rfl:  .  meloxicam (MOBIC) 15 MG tablet, Take 1 tablet (15 mg total) by mouth daily., Disp: 30 tablet, Rfl: 0  Social History   Tobacco Use  Smoking Status Never Smoker  Smokeless Tobacco Never Used    No Known Allergies Objective:  There were no vitals filed for this visit. There is no height or weight on file to calculate BMI. Constitutional Well developed. Well nourished.  Vascular Dorsalis pedis pulses palpable bilaterally. Posterior tibial pulses palpable bilaterally. Capillary refill normal to all digits.  No cyanosis or clubbing noted. Pedal hair growth  normal.  Neurologic Normal speech. Oriented to person, place, and time. Epicritic sensation to light touch grossly present bilaterally.  Dermatologic Nails well groomed and normal in appearance. No open wounds. No skin lesions.  Orthopedic:  Pain on palpation along the course of the peroneal tendon at the level of the insertion.  Pain along the insertion of the fifth metatarsal base.  Pain with resisted dorsiflexion eversion of the foot.  Mild pain with plantarflexion inversion of the foot.  These were consistent with active and passive range of motion.  No pain with dorsiflexion plantarflexion of the ankle joint.   Radiographs: 3 views of skeletally mature adult right foot: Talonavicular joint arthritis noted.  Mild anterior ankle joint arthritis noted.  Previous hardware from previous ankle fracture noted.  No loosening of the hardware or breaking of the hardware noted.  No fractures noted.  No other osseous abnormalities noted. Assessment:   1. Peroneal tendinitis of lower leg, right    Plan:  Patient was evaluated and treated and all questions answered.  Right peroneal tendinitis -I explained to the patient the etiology of peroneal tendinitis and various treatment options were extensively discussed.  Given the amount of pain that is having which is moderate to severe in nature I believe patient will benefit from complete immobilization with Cam boot to the right side. -Patient already has a  cam boot at home from previous ankle fracture.  Patient will place himself in the boot. -He also has a ankle brace from previous fracture as well I will plan on transitioning him to an ankle brace after and if he gets better with the boot.  If there is no improvement will consider MRI evaluation to rule out tears. -Mobic was dispensed for acute inflammation.   No follow-ups on file.

## 2020-07-13 ENCOUNTER — Encounter: Payer: Self-pay | Admitting: Podiatry

## 2020-07-13 ENCOUNTER — Ambulatory Visit (INDEPENDENT_AMBULATORY_CARE_PROVIDER_SITE_OTHER): Payer: PRIVATE HEALTH INSURANCE | Admitting: Podiatry

## 2020-07-13 ENCOUNTER — Other Ambulatory Visit: Payer: Self-pay

## 2020-07-13 DIAGNOSIS — M778 Other enthesopathies, not elsewhere classified: Secondary | ICD-10-CM | POA: Diagnosis not present

## 2020-07-13 DIAGNOSIS — M7671 Peroneal tendinitis, right leg: Secondary | ICD-10-CM

## 2020-07-13 NOTE — Progress Notes (Signed)
Subjective:  Patient ID: David Hansen, male    DOB: December 16, 1970,  MRN: [id number]  Chief Complaint  Patient presents with  . Foot Pain    "its a little better.  I have more pain in my ankle now with the boot off"    49 y.o. male presents with the above complaint.  Patient presents with a follow-up of right peroneal tendinitis with pain at the insertion.  Patient states the pain is still about the same.  It is not improving with the boot.  Patient states it feels a little bit better but has not gotten that much better.  He denies any other acute treatments.  He has been using his boot   Review of Systems: Negative except as noted in the HPI. Denies N/V/F/Ch.  Past Medical History:  Diagnosis Date  . Chicken pox   . Colon polyp 07/27/2011  . Depression   . Obesity   . Sleep apnea 2008   CPAP    Current Outpatient Medications:  .  cholecalciferol (VITAMIN D3) 25 MCG (1000 UNIT) tablet, Take 1,000 Units by mouth daily., Disp: , Rfl:  .  magnesium 30 MG tablet, Take 30 mg by mouth 2 (two) times daily., Disp: , Rfl:  .  meloxicam (MOBIC) 15 MG tablet, Take 1 tablet (15 mg total) by mouth daily., Disp: 30 tablet, Rfl: 0 .  vitamin B-12 (CYANOCOBALAMIN) 1000 MCG tablet, Take 1,000 mcg by mouth daily., Disp: , Rfl:   Social History   Tobacco Use  Smoking Status Never Smoker  Smokeless Tobacco Never Used    No Known Allergies Objective:  There were no vitals filed for this visit. There is no height or weight on file to calculate BMI. Constitutional Well developed. Well nourished.  Vascular Dorsalis pedis pulses palpable bilaterally. Posterior tibial pulses palpable bilaterally. Capillary refill normal to all digits.  No cyanosis or clubbing noted. Pedal hair growth normal.  Neurologic Normal speech. Oriented to person, place, and time. Epicritic sensation to light touch grossly present bilaterally.  Dermatologic Nails well groomed and normal in appearance. No open  wounds. No skin lesions.  Orthopedic:  Pain on palpation along the course of the peroneal tendon at the level of the insertion.  Pain along the insertion of the fifth metatarsal base.  Pain with resisted dorsiflexion eversion of the foot.  Mild pain with plantarflexion inversion of the foot.  These were consistent with active and passive range of motion.  No pain with dorsiflexion plantarflexion of the ankle joint.   Radiographs: 3 views of skeletally mature adult right foot: Talonavicular joint arthritis noted.  Mild anterior ankle joint arthritis noted.  Previous hardware from previous ankle fracture noted.  No loosening of the hardware or breaking of the hardware noted.  No fractures noted.  No other osseous abnormalities noted. Assessment:   1. Peroneal tendinitis of lower leg, right   2. Capsulitis of right foot    Plan:  Patient was evaluated and treated and all questions answered.  Right peroneal tendinitis with underlying foot capsulitis -I explained to the patient the etiology of peroneal tendinitis and various treatment options were extensively discussed.  Given the amount of pain that patient is experiencing I believe patient will benefit from a steroid injection to help decrease acute inflammatory component associated pain.  Patient agrees with the plan.  I also instructed him and educated him that this is near the tendon and therefore there is a risk of rupture associated with it.  Patient  states understanding would like to proceed with injection despite the risks. -A steroid injection was performed at right lateral foot using 1% plain Lidocaine and 10 mg of Kenalog. This was well tolerated. -Continue weightbearing as tolerated cam boot. -I also believe patient will benefit from physical therapy as well.  Prescription for referral to restore physical therapy was dispensed. -If there is no improvement will consider doing MRI during next visit.    No follow-ups on file.

## 2020-08-17 ENCOUNTER — Ambulatory Visit: Payer: PRIVATE HEALTH INSURANCE | Admitting: Podiatry

## 2021-02-09 ENCOUNTER — Other Ambulatory Visit: Payer: Self-pay | Admitting: Primary Care

## 2021-02-09 DIAGNOSIS — R7303 Prediabetes: Secondary | ICD-10-CM

## 2021-02-09 DIAGNOSIS — E349 Endocrine disorder, unspecified: Secondary | ICD-10-CM

## 2021-02-09 DIAGNOSIS — E538 Deficiency of other specified B group vitamins: Secondary | ICD-10-CM

## 2021-02-09 DIAGNOSIS — E559 Vitamin D deficiency, unspecified: Secondary | ICD-10-CM

## 2021-02-10 ENCOUNTER — Other Ambulatory Visit: Payer: Self-pay

## 2021-02-10 ENCOUNTER — Other Ambulatory Visit (INDEPENDENT_AMBULATORY_CARE_PROVIDER_SITE_OTHER): Payer: PRIVATE HEALTH INSURANCE

## 2021-02-10 DIAGNOSIS — E538 Deficiency of other specified B group vitamins: Secondary | ICD-10-CM | POA: Diagnosis not present

## 2021-02-10 DIAGNOSIS — E349 Endocrine disorder, unspecified: Secondary | ICD-10-CM | POA: Diagnosis not present

## 2021-02-10 DIAGNOSIS — E559 Vitamin D deficiency, unspecified: Secondary | ICD-10-CM

## 2021-02-10 DIAGNOSIS — R5383 Other fatigue: Secondary | ICD-10-CM

## 2021-02-10 DIAGNOSIS — R7303 Prediabetes: Secondary | ICD-10-CM | POA: Diagnosis not present

## 2021-02-10 LAB — VITAMIN D 25 HYDROXY (VIT D DEFICIENCY, FRACTURES): VITD: 28.07 ng/mL — ABNORMAL LOW (ref 30.00–100.00)

## 2021-02-10 LAB — VITAMIN B12: Vitamin B-12: 377 pg/mL (ref 211–911)

## 2021-02-10 LAB — TESTOSTERONE: Testosterone: 214.03 ng/dL — ABNORMAL LOW (ref 300.00–890.00)

## 2021-02-10 LAB — HEMOGLOBIN A1C: Hgb A1c MFr Bld: 5.9 % (ref 4.6–6.5)

## 2021-02-19 NOTE — Progress Notes (Signed)
02/21/2021  1:42 PM   Kaien Elson Clan 1971/09/18 [id number]  Referring provider: Doreene Nest, NP [address] [place],  [place] [address] Chief Complaint  Patient presents with  . Other  . Hypogonadism    HPI: David Hansen is a 50 y.o. male who presents today for evaluation and management of testosterone deficiency.   -Over the past year significant tiredness, fatigue and inability to lose weight with exercise.  Mild decreased libido but no ED - Patient has obstructive sleep apnea and uses a CPAP machine.   Testosterone trend: Component     Latest Ref Rng & Units 05/11/2020 02/10/2021  Testosterone     300.00 - 890.00 ng/dL 010.27 (L) 253.66 (L)     PMH: Past Medical History:  Diagnosis Date  . Chicken pox   . Colon polyp 07/27/2011  . Depression   . Obesity   . Sleep apnea 2008   CPAP    Surgical History: Past Surgical History:  Procedure Laterality Date  . COLONOSCOPY  07/27/2011   Dr Russella Dar  . COLONOSCOPY WITH PROPOFOL N/A 07/24/2018   Procedure: COLONOSCOPY WITH PROPOFOL;  Surgeon: Earline Mayotte, MD;  Location: [facility];  Service: Endoscopy;  Laterality: N/A;  . KNEE ARTHROSCOPY W/ ACL RECONSTRUCTION AND PATELLA GRAFT Right 1998   [facility] in [place]  Right knee    Home Medications:  Allergies as of 02/21/2021   No Known Allergies     Medication List       Accurate as of February 21, 2021  1:42 PM. If you have any questions, ask your nurse or doctor.        STOP taking these medications   meloxicam 15 MG tablet Commonly known as: MOBIC Stopped by: Riki Altes, MD     TAKE these medications   cholecalciferol 25 MCG (1000 UNIT) tablet Commonly known as: VITAMIN D3 Take 1,000 Units by mouth daily.   magnesium 30 MG tablet Take 30 mg by mouth 2 (two) times daily.   vitamin B-12 1000 MCG tablet Commonly known as: CYANOCOBALAMIN Take 1,000 mcg by mouth daily.       Allergies: No Known  Allergies  Family History: Family History  Problem Relation Age of Onset  . Cancer Father 62       colon cancer    Social History:  reports that he has never smoked. He has never used smokeless tobacco. He reports current alcohol use. He reports that he does not use drugs.   Physical Exam: BP 134/83   Pulse 82   Ht 6' (1.829 m)   Wt (!) 365 lb (165.6 kg)   BMI 49.50 kg/m   Constitutional:  Alert and oriented, No acute distress. HEENT: [place] AT, moist mucus membranes.  Trachea midline, no masses. Cardiovascular: No clubbing, cyanosis, or edema. Respiratory: Normal respiratory effort, no increased work of breathing. GI: Abdomen is soft, nontender, nondistended, no abdominal masses GU: No CVA tenderness, penis retracted without abnormalities, testes descended without masses, estimated volume 20 cc bilaterally; prostate  35 g, smooth without nodules Skin: No rashes, bruises or suspicious lesions. Neurologic: Grossly intact, no focal deficits, moving all 4 extremities. Psychiatric: Normal mood and affect.  Laboratory Data:   Lab Results  Component Value Date   TESTOSTERONE 214.03 (L) 02/10/2021    Assessment & Plan:    1. Hypogonadism  - Bothersome symptoms of tiredness, fatigue, decreased libido  - LH/prolactin ordered  -Various methods of TRT were reviewed including topical  preparations, intramuscular injections, subcutaneous testosterone (Xyosted), oral testosterone and a off label use of Clomid  - Potential side effects of testosterone replacement were discussed including stimulation of benign prostatic growth with lower urinary tract symptoms; erythrocytosis; edema; gynecomastia; worsening sleep apnea; venous thromboembolism; testicular atrophy and infertility. Recent studies suggesting an increased incidence of heart attack and stroke in patients taking testosterone was discussed. He was informed there is conflicting evidence regarding the impact of testosterone therapy  on cardiovascular risk. The theoretical risk of growth stimulation of an undetected prostate cancer was also discussed.  He was informed that current evidence does not provide any definitive answers regarding the risks of testosterone therapy on prostate cancer and cardiovascular disease. The need for periodic monitoring of his testosterone level, PSA, hematocrit and DRE was discussed.  IWyn Quaker, am acting as a Neurosurgeon for Dr. Lorin Picket C. Loletha Bertini,  I have reviewed the above documentation for accuracy and completeness, and I agree with the above.    Riki Altes, MD   [facility] [address], [address] [place], [place] [address] [phone]

## 2021-02-21 ENCOUNTER — Encounter: Payer: Self-pay | Admitting: Urology

## 2021-02-21 ENCOUNTER — Other Ambulatory Visit: Payer: Self-pay

## 2021-02-21 ENCOUNTER — Ambulatory Visit (INDEPENDENT_AMBULATORY_CARE_PROVIDER_SITE_OTHER): Payer: PRIVATE HEALTH INSURANCE | Admitting: Urology

## 2021-02-21 VITALS — BP 134/83 | HR 82 | Ht 72.0 in | Wt 365.0 lb

## 2021-02-21 DIAGNOSIS — E291 Testicular hypofunction: Secondary | ICD-10-CM

## 2021-02-21 DIAGNOSIS — E349 Endocrine disorder, unspecified: Secondary | ICD-10-CM

## 2021-02-22 ENCOUNTER — Telehealth: Payer: Self-pay | Admitting: Urology

## 2021-02-22 LAB — LUTEINIZING HORMONE: LH: 6.3 m[IU]/mL (ref 1.7–8.6)

## 2021-02-22 LAB — PROLACTIN: Prolactin: 16.4 ng/mL — ABNORMAL HIGH (ref 4.0–15.2)

## 2021-02-22 NOTE — Telephone Encounter (Signed)
Prolactin level mildly elevated at 16.4 but this can be followed.  LH was within normal range.  We discussed testosterone treatment options at yesterday's visit.  Does he have a preference based on our discussion?

## 2021-02-23 ENCOUNTER — Encounter: Payer: Self-pay | Admitting: *Deleted

## 2021-02-23 ENCOUNTER — Encounter: Payer: Self-pay | Admitting: Urology

## 2021-02-24 ENCOUNTER — Other Ambulatory Visit: Payer: Self-pay | Admitting: Urology

## 2021-02-24 MED ORDER — ANDRODERM 4 MG/24HR TD PT24: 1.0000 | MEDICATED_PATCH | Freq: Every day | TRANSDERMAL | 2 refills | Status: DC

## 2021-02-25 ENCOUNTER — Encounter: Payer: Self-pay | Admitting: *Deleted

## 2021-02-28 ENCOUNTER — Telehealth: Payer: Self-pay | Admitting: Urology

## 2021-02-28 NOTE — Telephone Encounter (Signed)
Pt called and has a question concerning his medication and insurance coverage.  Please call pt.

## 2021-02-28 NOTE — Telephone Encounter (Signed)
Pt. Called with questions about his prescription and his insurance coverage.  Please call pt.

## 2021-03-03 ENCOUNTER — Telehealth: Payer: Self-pay | Admitting: Family Medicine

## 2021-03-03 NOTE — Telephone Encounter (Signed)
Patient notified Androderm patch has been approved Approval dates: 03/02/2021-09/02/2021

## 2021-03-22 ENCOUNTER — Other Ambulatory Visit: Payer: Self-pay | Admitting: *Deleted

## 2021-03-22 DIAGNOSIS — E291 Testicular hypofunction: Secondary | ICD-10-CM

## 2021-03-23 ENCOUNTER — Encounter: Payer: Self-pay | Admitting: *Deleted

## 2021-03-23 NOTE — Telephone Encounter (Signed)
Patient left a message on the triage line to return call. Attempted to return call no answer left message

## 2021-03-23 NOTE — Telephone Encounter (Signed)
Spoke with patient and he was instructed to change patch sites. He confirms that he rotates patch sites daily per instructions. He states irritation is getting worse and sometimes lasts up to 4 days with redness and itching. He was instructed per Dr. Bernardo Heater to try OTC hydrocortisone cream for irritation. If this does not help or site irritation worsens he should call back to discuss the possibility of a different therapy option. Patient verbalized understanding

## 2021-04-14 DIAGNOSIS — R7303 Prediabetes: Secondary | ICD-10-CM

## 2021-04-28 ENCOUNTER — Other Ambulatory Visit: Payer: PRIVATE HEALTH INSURANCE

## 2021-04-28 ENCOUNTER — Other Ambulatory Visit: Payer: Self-pay

## 2021-04-28 DIAGNOSIS — E291 Testicular hypofunction: Secondary | ICD-10-CM

## 2021-04-29 LAB — TESTOSTERONE: Testosterone: 548 ng/dL (ref 264–916)

## 2021-05-02 ENCOUNTER — Telehealth: Payer: Self-pay | Admitting: Urology

## 2021-05-02 NOTE — Telephone Encounter (Signed)
Testosterone level looks good at 548.  Has he noted symptom improvement?  If symptoms have improved would continue the present dose.  Please schedule a follow-up office visit with me and 4 months with testosterone, PSA and hematocrit levels prior

## 2021-05-03 ENCOUNTER — Encounter: Payer: Self-pay | Admitting: *Deleted

## 2021-05-03 MED ORDER — BLOOD GLUCOSE MONITOR KIT: PACK | 0 refills | Status: AC

## 2021-05-03 NOTE — Telephone Encounter (Signed)
David Hansen, will you fax this glucometer kit to his pharmacy? Prescription placed in your inbox.

## 2021-05-17 ENCOUNTER — Encounter: Payer: PRIVATE HEALTH INSURANCE | Admitting: Primary Care

## 2021-05-26 ENCOUNTER — Other Ambulatory Visit: Payer: Self-pay | Admitting: Primary Care

## 2021-05-26 DIAGNOSIS — R7303 Prediabetes: Secondary | ICD-10-CM

## 2021-05-27 ENCOUNTER — Other Ambulatory Visit: Payer: Self-pay | Admitting: Primary Care

## 2021-05-27 ENCOUNTER — Other Ambulatory Visit: Payer: Self-pay | Admitting: Urology

## 2021-06-01 ENCOUNTER — Telehealth: Payer: Self-pay | Admitting: Internal Medicine

## 2021-06-01 NOTE — Telephone Encounter (Signed)
Called and spoke with pt and advised him since he was last seen 2019 he would need OV.  Advised that he would need to follow up each year per office protocol and insurance.    Pt stated that he travels a lot and is wanting to get set up with a travel cpap.  He stated that his insurance will not cover this but he just needs the rx for this so he can order it online.  He has scheduled appt with Dr. Mortimer Fries on 07/18 at 830.  Dr. Mortimer Fries please advise. Thanks

## 2021-06-01 NOTE — Telephone Encounter (Signed)
Lm for patient.  

## 2021-06-02 NOTE — Telephone Encounter (Signed)
Lm for nurse supervisor, Ashley Akin.

## 2021-06-02 NOTE — Telephone Encounter (Signed)
Spoke to [facility] and verified that appt is needed prior to prescribing cpap,as patient was last seen in office 2 years ago.  I have relayed this message to patient. Patient would like sooner appt then 06/20/2021. Patient is willing to travel to [facility] for appt. Appt scheduled for 06/07/2021 at 2:00. Patient has been provided with address.  Nothing further needed at this time.

## 2021-06-07 ENCOUNTER — Other Ambulatory Visit: Payer: Self-pay

## 2021-06-07 ENCOUNTER — Ambulatory Visit (INDEPENDENT_AMBULATORY_CARE_PROVIDER_SITE_OTHER): Payer: PRIVATE HEALTH INSURANCE | Admitting: Adult Health

## 2021-06-07 VITALS — BP 138/90 | HR 77

## 2021-06-07 DIAGNOSIS — G4733 Obstructive sleep apnea (adult) (pediatric): Secondary | ICD-10-CM

## 2021-06-07 NOTE — Progress Notes (Signed)
_0  ID: David Hansen, male    DOB: [birth year], 50 y.o.   MRN: [id number]  Chief Complaint  Patient presents with   Sleep Apnea    Current cpap working ok. Wants a traveling cpap.    Referring provider: Pleas Koch, NP  HPI: 50 year old male followed for obstructive sleep apnea  TEST/EVENTS :  CPAP download 09/16/2018-10/15/2018>> raw data personally reviewed.  Usage is 29/30 days.  Average usage on days used is 6 hours 20 minutes.  Set pressure of 5-20.  Median pressure is 8, 95th percentile pressure 10, maximum pressure 11, leaks are within normal limits.  Residual AHI is 0.9.  Overall this shows very good compliance with CPAP with excellent control of obstructive sleep apnea.  06/07/2021 Follow up : OSA  Patient presents for a follow-up visit for obstructive sleep apnea.  Patient was last seen in November 2019 former patient of Dr. Ashby Dawes .  Patient says he wears his CPAP every night.  Gets in about 6 hours each night.  Feels that he is rested with no significant daytime sleepiness.  Feels that he benefits from CPAP.  Patient says he needs a new CPAP machine.  Would like a travel mini CPAP if possible. Patient's current weight is 365 pounds.  We discussed healthy weight loss. Has lost 15lbs, working on exercise and healthy diet .   CPAP download shows excellent compliance with daily average usage at 6.5 hours.  Patient is on auto CPAP 5 to 20 cm H2O.  AHI is 1.0.  Positive air leaks. Luz Lex a lot and also does backpacking . Uses nasal masks Needs order for new supplies .     No Known Allergies  Immunization History  Administered Date(s) Administered   Influenza Whole 09/13/2010   Influenza,inj,Quad PF,6+ Mos 09/05/2013, 09/16/2015, 12/27/2016, 10/09/2019   Influenza-Unspecified 09/03/2014   Moderna Sars-Covid-2 Vaccination 02/05/2020, 03/04/2020   Tdap 12/27/2012, 09/16/2015    Past Medical History:  Diagnosis Date   Chicken pox    Colon polyp  07/27/2011   Depression    Obesity    Sleep apnea 2008   CPAP    Tobacco History: Social History   Tobacco Use  Smoking Status Never  Smokeless Tobacco Never   Counseling given: Not Answered   Outpatient Medications Prior to Visit  Medication Sig Dispense Refill   ANDRODERM 4 MG/24HR PT24 patch APPLY 1 PATCH ONTO THE SKIN EVERY DAY 30 patch 3   blood glucose meter kit and supplies KIT Dispense based on patient and insurance preference. Use up to four times daily as directed. (FOR ICD-9 250.00, 250.01). 1 each 0   cholecalciferol (VITAMIN D3) 25 MCG (1000 UNIT) tablet Take 1,000 Units by mouth daily.     Lancets (ONETOUCH DELICA PLUS [id number]) MISC USE UP TO 4 TIMES A DAY 100 each 0   magnesium 30 MG tablet Take 30 mg by mouth 2 (two) times daily.     ONETOUCH VERIO test strip USE TO CHECK BLOOD GLUCOSE UP TO 4 TIMES A DAY 100 strip 0   vitamin B-12 (CYANOCOBALAMIN) 1000 MCG tablet Take 1,000 mcg by mouth daily.     No facility-administered medications prior to visit.     Review of Systems:   Constitutional:   No  weight loss, night sweats,  Fevers, chills, fatigue, or  lassitude.  HEENT:   No headaches,  Difficulty swallowing,  Tooth/dental problems, or  Sore throat,  No sneezing, itching, ear ache, nasal congestion, post nasal drip,   CV:  No chest pain,  Orthopnea, PND, swelling in lower extremities, anasarca, dizziness, palpitations, syncope.   GI  No heartburn, indigestion, abdominal pain, nausea, vomiting, diarrhea, change in bowel habits, loss of appetite, bloody stools.   Resp: No shortness of breath with exertion or at rest.  No excess mucus, no productive cough,  No non-productive cough,  No coughing up of blood.  No change in color of mucus.  No wheezing.  No chest wall deformity  Skin: no rash or lesions.  GU: no dysuria, change in color of urine, no urgency or frequency.  No flank pain, no hematuria   MS:  No joint pain or swelling.  No  decreased range of motion.  No back pain.    Physical Exam  BP 138/90   Pulse 77   SpO2 96%   GEN: A/Ox3; pleasant , NAD, well nourished    HEENT:  [place]/AT,   NOSE-clear, THROAT-clear, no lesions, no postnasal drip or exudate noted. Class 2-3 MP airway   NECK:  Supple w/ fair ROM; no JVD; normal carotid impulses w/o bruits; no thyromegaly or nodules palpated; no lymphadenopathy.    RESP  Clear  P & A; w/o, wheezes/ rales/ or rhonchi. no accessory muscle use, no dullness to percussion  CARD:  RRR, no m/r/g, no peripheral edema, pulses intact, no cyanosis or clubbing.  GI:   Soft & nt; nml bowel sounds; no organomegaly or masses detected.   Musco: Warm bil, no deformities or joint swelling noted.   Neuro: alert, no focal deficits noted.    Skin: Warm, no lesions or rashes    Lab Results:      BNP No results found for: BNP  ProBNP No results found for: PROBNP  Imaging: No results found.    No flowsheet data found.  No results found for: NITRICOXIDE      Assessment & Plan:   No problem-specific Assessment & Plan notes found for this encounter.     Rexene Edison, NP 06/07/2021

## 2021-06-07 NOTE — Patient Instructions (Signed)
Keep up good work.  Order for new CPAP machine .(Travel Resmed Mini if insurance will cover)  Work on healthy weight  Continue on CPAP At bedtime   Do not drive if sleepy  Follow up with Dr. Mortimer Fries or Alece Koppel NP in 1 year and As needed

## 2021-06-08 ENCOUNTER — Other Ambulatory Visit: Payer: Self-pay

## 2021-06-08 ENCOUNTER — Ambulatory Visit (INDEPENDENT_AMBULATORY_CARE_PROVIDER_SITE_OTHER): Payer: PRIVATE HEALTH INSURANCE | Admitting: Primary Care

## 2021-06-08 ENCOUNTER — Encounter: Payer: Self-pay | Admitting: Primary Care

## 2021-06-08 VITALS — BP 118/74 | HR 65 | Temp 98.3°F | Ht 72.0 in | Wt 352.0 lb

## 2021-06-08 DIAGNOSIS — E559 Vitamin D deficiency, unspecified: Secondary | ICD-10-CM | POA: Diagnosis not present

## 2021-06-08 DIAGNOSIS — Z0000 Encounter for general adult medical examination without abnormal findings: Secondary | ICD-10-CM | POA: Diagnosis not present

## 2021-06-08 DIAGNOSIS — G4733 Obstructive sleep apnea (adult) (pediatric): Secondary | ICD-10-CM | POA: Diagnosis not present

## 2021-06-08 DIAGNOSIS — R7303 Prediabetes: Secondary | ICD-10-CM

## 2021-06-08 DIAGNOSIS — E538 Deficiency of other specified B group vitamins: Secondary | ICD-10-CM

## 2021-06-08 DIAGNOSIS — R5383 Other fatigue: Secondary | ICD-10-CM

## 2021-06-08 DIAGNOSIS — Z800 Family history of malignant neoplasm of digestive organs: Secondary | ICD-10-CM

## 2021-06-08 NOTE — Assessment & Plan Note (Signed)
Colonoscopy UTD, due in 2024.

## 2021-06-08 NOTE — Assessment & Plan Note (Signed)
Excellent control and compliance   Plan Patient Instructions  Keep up good work.  Order for new CPAP machine .(Travel Resmed Mini if insurance will cover)  Work on healthy weight  Continue on CPAP At bedtime   Do not drive if sleepy  Follow up with Dr. Mortimer Fries or Madysyn Hanken NP in 1 year and As needed

## 2021-06-08 NOTE — Assessment & Plan Note (Signed)
Weight loss discussed  

## 2021-06-08 NOTE — Assessment & Plan Note (Signed)
Improving, following with Urology and is on Androgerm patches. Continue current regimen.

## 2021-06-08 NOTE — Patient Instructions (Signed)
Stop by the lab prior to leaving today. I will notify you of your results once received.   It was a pleasure to see you today!  Preventive Care 90-50 Years Old, Male Preventive care refers to lifestyle choices and visits with your health care provider that can promote health and wellness. This includes: A yearly physical exam. This is also called an annual wellness visit. Regular dental and eye exams. Immunizations. Screening for certain conditions. Healthy lifestyle choices, such as: Eating a healthy diet. Getting regular exercise. Not using drugs or products that contain nicotine and tobacco. Limiting alcohol use. What can I expect for my preventive care visit? Physical exam Your health care provider will check your: Height and weight. These may be used to calculate your BMI (body mass index). BMI is a measurement that tells if you are at a healthy weight. Heart rate and blood pressure. Body temperature. Skin for abnormal spots. Counseling Your health care provider may ask you questions about your: Past medical problems. Family's medical history. Alcohol, tobacco, and drug use. Emotional well-being. Home life and relationship well-being. Sexual activity. Diet, exercise, and sleep habits. Work and work Statistician. Access to firearms. What immunizations do I need?  Vaccines are usually given at various ages, according to a schedule. Your health care provider will recommend vaccines for you based on your age, medicalhistory, and lifestyle or other factors, such as travel or where you work. What tests do I need? Blood tests Lipid and cholesterol levels. These may be checked every 5 years, or more often if you are over 42 years old. Hepatitis C test. Hepatitis B test. Screening Lung cancer screening. You may have this screening every year starting at age 32 if you have a 30-pack-year history of smoking and currently smoke or have quit within the past 15 years. Prostate cancer  screening. Recommendations will vary depending on your family history and other risks. Genital exam to check for testicular cancer or hernias. Colorectal cancer screening. All adults should have this screening starting at age 33 and continuing until age 13. Your health care provider may recommend screening at age 54 if you are at increased risk. You will have tests every 1-10 years, depending on your results and the type of screening test. Diabetes screening. This is done by checking your blood sugar (glucose) after you have not eaten for a while (fasting). You may have this done every 1-3 years. STD (sexually transmitted disease) testing, if you are at risk. Follow these instructions at home: Eating and drinking  Eat a diet that includes fresh fruits and vegetables, whole grains, lean protein, and low-fat dairy products. Take vitamin and mineral supplements as recommended by your health care provider. Do not drink alcohol if your health care provider tells you not to drink. If you drink alcohol: Limit how much you have to 0-2 drinks a day. Be aware of how much alcohol is in your drink. In the [place] one drink equals one 12 oz bottle of beer (355 mL), one 5 oz glass of wine (148 mL), or one 1 oz glass of hard liquor (44 mL).  Lifestyle Take daily care of your teeth and gums. Brush your teeth every morning and night with fluoride toothpaste. Floss one time each day. Stay active. Exercise for at least 30 minutes 5 or more days each week. Do not use any products that contain nicotine or tobacco, such as cigarettes, e-cigarettes, and chewing tobacco. If you need help quitting, ask your health care provider. Do  not use drugs. If you are sexually active, practice safe sex. Use a condom or other form of protection to prevent STIs (sexually transmitted infections). If told by your health care provider, take low-dose aspirin daily starting at age 74. Find healthy ways to cope with stress, such  as: Meditation, yoga, or listening to music. Journaling. Talking to a trusted person. Spending time with friends and family. Safety Always wear your seat belt while driving or riding in a vehicle. Do not drive: If you have been drinking alcohol. Do not ride with someone who has been drinking. When you are tired or distracted. While texting. Wear a helmet and other protective equipment during sports activities. If you have firearms in your house, make sure you follow all gun safety procedures. What's next? Go to your health care provider once a year for an annual wellness visit. Ask your health care provider how often you should have your eyes and teeth checked. Stay up to date on all vaccines. This information is not intended to replace advice given to you by your health care provider. Make sure you discuss any questions you have with your healthcare provider. Document Revised: 08/19/2019 Document Reviewed: 11/14/2018 Elsevier Patient Education  2022 [employer].

## 2021-06-08 NOTE — Progress Notes (Signed)
Subjective:    Patient ID: David Hansen, male    DOB: 10-Jan-1971, 50 y.o.   MRN: [id number]  HPI  David Hansen is a very pleasant 50 y.o. male who presents today for complete physical.  Immunizations: -Tetanus: 2016 -Influenza: Due this season  -Covid-19: 2 vaccines -Shingles: Due tomorrow.  Diet: Fair diet.  Exercise: Walking and rides bike.  Eye exam: Completes annually  Dental exam: Completes semi-annually   Colonoscopy: Completed in 2019, due 2024  BP Readings from Last 3 Encounters:  06/08/21 118/74  06/07/21 138/90  02/21/21 134/83        Review of Systems  Constitutional:  Negative for unexpected weight change.  HENT:  Negative for rhinorrhea.   Respiratory:  Negative for cough and shortness of breath.   Cardiovascular:  Negative for chest pain.  Gastrointestinal:  Negative for constipation and diarrhea.  Genitourinary:  Negative for difficulty urinating.  Musculoskeletal:  Negative for arthralgias and myalgias.  Skin:  Negative for rash.  Allergic/Immunologic: Negative for environmental allergies.  Neurological:  Negative for dizziness, numbness and headaches.  Psychiatric/Behavioral:  The patient is not nervous/anxious.         Past Medical History:  Diagnosis Date   Chicken pox    Colon polyp 07/27/2011   Depression    Obesity    Sleep apnea 2008   CPAP    Social History   Socioeconomic History   Marital status: Married    Spouse name: Not on file   Number of children: 2   Years of education: Not on file   Highest education level: Not on file  Occupational History   Occupation: Land  Tobacco Use   Smoking status: Never   Smokeless tobacco: Never  Substance and Sexual Activity   Alcohol use: Yes    Alcohol/week: 0.0 standard drinks    Comment: rarely   Drug use: No   Sexual activity: Not on file  Other Topics Concern   Not on file  Social History Narrative   Regular exercise yes 2-3 times a  week...walking      Diet: fruits and veggies,lean protein, water   Social Determinants of Health   Financial Resource Strain: Not on file  Food Insecurity: Not on file  Transportation Needs: Not on file  Physical Activity: Not on file  Stress: Not on file  Social Connections: Not on file  Intimate Partner Violence: Not on file    Past Surgical History:  Procedure Laterality Date   COLONOSCOPY  07/27/2011   Dr Fuller Plan   COLONOSCOPY WITH PROPOFOL N/A 07/24/2018   Procedure: COLONOSCOPY WITH PROPOFOL;  Surgeon: Robert Bellow, MD;  Location: [facility] ENDOSCOPY;  Service: Endoscopy;  Laterality: N/A;   KNEE ARTHROSCOPY W/ ACL RECONSTRUCTION AND PATELLA GRAFT Right 1998   [facility] in [place]  Right knee    Family History  Problem Relation Age of Onset   Cancer Father 59       colon cancer    No Known Allergies  Current Outpatient Medications on File Prior to Visit  Medication Sig Dispense Refill   ANDRODERM 4 MG/24HR PT24 patch APPLY 1 PATCH ONTO THE SKIN EVERY DAY 30 patch 3   blood glucose meter kit and supplies KIT Dispense based on patient and insurance preference. Use up to four times daily as directed. (FOR ICD-9 250.00, 250.01). 1 each 0   cholecalciferol (VITAMIN D3) 25 MCG (1000 UNIT) tablet Take 1,000 Units by mouth daily.     Lancets (  ONETOUCH DELICA PLUS [id number]) MISC USE UP TO 4 TIMES A DAY 100 each 0   magnesium 30 MG tablet Take 30 mg by mouth 2 (two) times daily.     ONETOUCH VERIO test strip USE TO CHECK BLOOD GLUCOSE UP TO 4 TIMES A DAY 100 strip 0   vitamin B-12 (CYANOCOBALAMIN) 1000 MCG tablet Take 1,000 mcg by mouth daily.     No current facility-administered medications on file prior to visit.    There were no vitals taken for this visit. Objective:   Physical Exam HENT:     Right Ear: Tympanic membrane and ear canal normal.     Left Ear: Tympanic membrane and ear canal normal.     Nose: Nose normal.     Right Sinus: No maxillary sinus  tenderness or frontal sinus tenderness.     Left Sinus: No maxillary sinus tenderness or frontal sinus tenderness.  Eyes:     Conjunctiva/sclera: Conjunctivae normal.  Neck:     Thyroid: No thyromegaly.     Vascular: No carotid bruit.  Cardiovascular:     Rate and Rhythm: Normal rate and regular rhythm.     Heart sounds: Normal heart sounds.  Pulmonary:     Effort: Pulmonary effort is normal.     Breath sounds: Normal breath sounds. No wheezing or rales.  Abdominal:     General: Bowel sounds are normal.     Palpations: Abdomen is soft.     Tenderness: There is no abdominal tenderness.  Musculoskeletal:        General: Normal range of motion.     Cervical back: Neck supple.  Skin:    General: Skin is warm and dry.  Neurological:     Mental Status: He is alert and oriented to person, place, and time.     Cranial Nerves: No cranial nerve deficit.     Deep Tendon Reflexes: Reflexes are normal and symmetric.  Psychiatric:        Mood and Affect: Mood normal.          Assessment & Plan:      This visit occurred during the SARS-CoV-2 public health emergency.  Safety protocols were in place, including screening questions prior to the visit, additional usage of staff PPE, and extensive cleaning of exam room while observing appropriate contact time as indicated for disinfecting solutions.

## 2021-06-08 NOTE — Assessment & Plan Note (Signed)
Compliant to CPAP machine nightly, follows with pulmonology.

## 2021-06-08 NOTE — Assessment & Plan Note (Signed)
A1C of 5.9 three months ago. Commended him on weight loss, encouraged to continue!  Repeat A1C pending.

## 2021-06-08 NOTE — Assessment & Plan Note (Addendum)
Shingrix due tomorrow, we set up some nurse visits. Colonoscopy UTD, due in 2024.  Discussed the importance of a healthy diet and regular exercise in order for weight loss, and to reduce the risk of further co-morbidity.  Exam today stable. Labs reviewed and pending.

## 2021-06-09 DIAGNOSIS — G4733 Obstructive sleep apnea (adult) (pediatric): Secondary | ICD-10-CM

## 2021-06-09 LAB — COMPREHENSIVE METABOLIC PANEL
ALT: 25 U/L (ref 0–53)
AST: 20 U/L (ref 0–37)
Albumin: 4.4 g/dL (ref 3.5–5.2)
Alkaline Phosphatase: 38 U/L — ABNORMAL LOW (ref 39–117)
BUN: 15 mg/dL (ref 6–23)
CO2: 28 mEq/L (ref 19–32)
Calcium: 9.5 mg/dL (ref 8.4–10.5)
Chloride: 101 mEq/L (ref 96–112)
Creatinine, Ser: 1 mg/dL (ref 0.40–1.50)
GFR: 88.07 mL/min (ref >60.00)
Glucose, Bld: 80 mg/dL (ref 70–99)
Potassium: 4.3 mEq/L (ref 3.5–5.1)
Sodium: 138 mEq/L (ref 135–145)
Total Bilirubin: 0.6 mg/dL (ref 0.2–1.2)
Total Protein: 7.2 g/dL (ref 6.0–8.3)

## 2021-06-09 LAB — LIPID PANEL
Cholesterol: 198 mg/dL (ref 0–200)
HDL: 41.2 mg/dL (ref >39.00)
LDL Cholesterol: 127 mg/dL — ABNORMAL HIGH (ref 0–99)
NonHDL: 156.69
Total CHOL/HDL Ratio: 5
Triglycerides: 147 mg/dL (ref 0.0–149.0)
VLDL: 29.4 mg/dL (ref 0.0–40.0)

## 2021-06-09 LAB — HEMOGLOBIN A1C: Hgb A1c MFr Bld: 5.9 % (ref 4.6–6.5)

## 2021-06-09 LAB — CBC
HCT: 40.8 % (ref 39.0–52.0)
Hemoglobin: 13.9 g/dL (ref 13.0–17.0)
MCHC: 34.1 g/dL (ref 30.0–36.0)
MCV: 87.2 fl (ref 78.0–100.0)
Platelets: 256 10*3/uL (ref 150.0–400.0)
RBC: 4.67 Mil/uL (ref 4.22–5.81)
RDW: 14.1 % (ref 11.5–15.5)
WBC: 4.4 10*3/uL (ref 4.0–10.5)

## 2021-06-09 LAB — VITAMIN B12: Vitamin B-12: 401 pg/mL (ref 211–911)

## 2021-06-09 LAB — VITAMIN D 25 HYDROXY (VIT D DEFICIENCY, FRACTURES): VITD: 26.61 ng/mL — ABNORMAL LOW (ref 30.00–100.00)

## 2021-06-09 NOTE — Telephone Encounter (Signed)
TP please advise if you are ok for the new order for a new cpap machine and he would also like to purchase the mini for travel.  Pt stated that the rx sent was for supplies only.  Thanks

## 2021-06-09 NOTE — Telephone Encounter (Signed)
Yes please send order for home CPAP.  Also can mail in order to him for his travel CPAP with same settings.

## 2021-06-09 NOTE — Telephone Encounter (Signed)
Patient would like copy of order for new CPAP machine. Patient would like sent to mychart. Patient phone number is [phone].

## 2021-06-10 ENCOUNTER — Telehealth: Payer: Self-pay | Admitting: Adult Health

## 2021-06-10 NOTE — Telephone Encounter (Signed)
Order was placed at pt's OV 06/07/21 for pt to receive a travel cpap which did mention to have pt's current machine replaced.  I have placed order for pt to receive cpap home so he can be able to use it for travel.  Mychart message has been sent to pt about this too. Nothing further needed.

## 2021-06-10 NOTE — Telephone Encounter (Signed)
I called and spoke with patient regarding CPAP script. It looks like David Hansen had informed patient on mychart message that the script was put upfront at the front office and informed patient that he can stop by check in and they will give it to him. Patient stated he will be here after 4pm. Patient verbalized understanding, nothing further needed.

## 2021-06-20 ENCOUNTER — Ambulatory Visit: Payer: Self-pay | Admitting: Internal Medicine

## 2021-06-22 ENCOUNTER — Other Ambulatory Visit: Payer: Self-pay

## 2021-06-22 ENCOUNTER — Ambulatory Visit (INDEPENDENT_AMBULATORY_CARE_PROVIDER_SITE_OTHER): Payer: PRIVATE HEALTH INSURANCE

## 2021-06-22 DIAGNOSIS — Z23 Encounter for immunization: Secondary | ICD-10-CM

## 2021-06-22 NOTE — Progress Notes (Signed)
Per orders of Alma Friendly, NP injection of Shingrix #1 given by Kris Mouton. Patient tolerated injection well.

## 2021-07-01 ENCOUNTER — Telehealth: Payer: Self-pay | Admitting: Surgery

## 2021-07-01 NOTE — Telephone Encounter (Signed)
Outgoing call is made, left message for patient to call.  He is coming up due for his 3 yr colonoscopy.  Need to see if patient wants to continue seeing Dr. Bary Castilla [phone])  or if wants referral to [facility] for his colonoscopy.

## 2021-08-24 ENCOUNTER — Other Ambulatory Visit: Payer: Self-pay

## 2021-08-24 DIAGNOSIS — E291 Testicular hypofunction: Secondary | ICD-10-CM

## 2021-08-31 ENCOUNTER — Encounter: Payer: Self-pay | Admitting: Urology

## 2021-09-01 ENCOUNTER — Other Ambulatory Visit: Payer: PRIVATE HEALTH INSURANCE

## 2021-09-01 ENCOUNTER — Other Ambulatory Visit: Payer: Self-pay

## 2021-09-01 DIAGNOSIS — E291 Testicular hypofunction: Secondary | ICD-10-CM

## 2021-09-02 LAB — TESTOSTERONE: Testosterone: 509 ng/dL (ref 264–916)

## 2021-09-02 LAB — PSA: Prostate Specific Ag, Serum: 1.1 ng/mL (ref 0.0–4.0)

## 2021-09-02 LAB — HEMATOCRIT: Hematocrit: 43.8 % (ref 37.5–51.0)

## 2021-09-05 ENCOUNTER — Other Ambulatory Visit: Payer: Self-pay

## 2021-09-05 ENCOUNTER — Ambulatory Visit (INDEPENDENT_AMBULATORY_CARE_PROVIDER_SITE_OTHER): Payer: PRIVATE HEALTH INSURANCE | Admitting: Urology

## 2021-09-05 ENCOUNTER — Encounter: Payer: Self-pay | Admitting: Urology

## 2021-09-05 VITALS — BP 118/76 | HR 77 | Ht 72.0 in | Wt 354.0 lb

## 2021-09-05 DIAGNOSIS — E291 Testicular hypofunction: Secondary | ICD-10-CM | POA: Diagnosis not present

## 2021-09-05 MED ORDER — TRIAMCINOLONE ACETONIDE 0.5 % EX OINT: 1.0000 "application " | TOPICAL_OINTMENT | Freq: Two times a day (BID) | CUTANEOUS | 0 refills | Status: DC

## 2021-09-05 NOTE — Addendum Note (Signed)
Addended by: Kyra Manges on: 09/05/2021 01:32 PM   Modules accepted: Orders

## 2021-09-05 NOTE — Progress Notes (Signed)
09/05/2021 1:00 PM   David Hansen 25-Mar-1971 [id number]  Referring provider: Pleas Koch, NP [place],  [place] [address]  Chief Complaint  Patient presents with   Hypogonadism    HPI: 50 y.o. male presents for follow-up of hypogonadism.  Initially seen 02/21/2021 with complaints of significant tiredness, fatigue, weight gain and decreased libido for 1 year Testosterone levels in an low 200 range Elected Androderm and follow-up testosterone level May 2022 was 548 Has noted symptom improvement on TRT Labs 09/01/2021 remarkable for a testosterone of 509, hematocrit 43.8 and PSA 1.1 Is having dermatitis at patch application site.  He is applying OTC hydrocortisone cream and rotating the site   PMH: Past Medical History:  Diagnosis Date   Chicken pox    Colon polyp 07/27/2011   Depression    Obesity    Sleep apnea 2008   CPAP    Surgical History: Past Surgical History:  Procedure Laterality Date   COLONOSCOPY  07/27/2011   Dr Fuller Plan   COLONOSCOPY WITH PROPOFOL N/A 07/24/2018   Procedure: COLONOSCOPY WITH PROPOFOL;  Surgeon: Robert Bellow, MD;  Location: [facility] ENDOSCOPY;  Service: Endoscopy;  Laterality: N/A;   KNEE ARTHROSCOPY W/ ACL RECONSTRUCTION AND PATELLA GRAFT Right 1998   [facility] in [place]  Right knee    Home Medications:  Allergies as of 09/05/2021   No Known Allergies      Medication List        Accurate as of September 05, 2021  1:00 PM. If you have any questions, ask your nurse or doctor.          Androderm 4 MG/24HR Pt24 patch Generic drug: testosterone APPLY 1 PATCH ONTO THE SKIN EVERY DAY   blood glucose meter kit and supplies Kit Dispense based on patient and insurance preference. Use up to four times daily as directed. (FOR ICD-9 250.00, 250.01).   cholecalciferol 25 MCG (1000 UNIT) tablet Commonly known as: VITAMIN D3 Take 1,000 Units by mouth daily.   magnesium 30 MG tablet Take 30 mg by mouth 2  (two) times daily.   OneTouch Delica Plus [id number] Misc USE UP TO 4 TIMES A DAY   OneTouch Verio test strip Generic drug: glucose blood USE TO CHECK BLOOD GLUCOSE UP TO 4 TIMES A DAY   vitamin B-12 1000 MCG tablet Commonly known as: CYANOCOBALAMIN Take 1,000 mcg by mouth daily.        Allergies: No Known Allergies  Family History: Family History  Problem Relation Age of Onset   Cancer Father 2       colon cancer    Social History:  reports that he has never smoked. He has never used smokeless tobacco. He reports current alcohol use. He reports that he does not use drugs.   Physical Exam: BP 118/76   Pulse 77   Ht 6' (1.829 m)   Wt (!) 354 lb (160.6 kg)   BMI 48.01 kg/m   Constitutional:  Alert and oriented, No acute distress. HEENT: [place] AT, moist mucus membranes.  Trachea midline, no masses. Cardiovascular: No clubbing, cyanosis, or edema. Respiratory: Normal respiratory effort, no increased work of breathing.   Assessment & Plan:    1.  Hypogonadism Desires to continue TRT 50-monthfollow-up with testosterone, PSA, hematocrit, prolactin and DRE Rx triamcinolone cream sent to pharmacy for d[place] MD  [place] [place] [phone]

## 2021-09-15 ENCOUNTER — Telehealth: Payer: Self-pay | Admitting: Urology

## 2021-09-15 NOTE — Telephone Encounter (Signed)
David Hansen is working on it now. Advised patient we never got a prior [place]

## 2021-09-15 NOTE — Telephone Encounter (Signed)
Patient called in wanting to know the status of the prior auth on the Androderm patches (he is currently out). He would like a call back at [phone].

## 2021-09-16 NOTE — Telephone Encounter (Signed)
Attempted a PA 2 times and both were cancelled by [employer]. I did another PA on the insurance web site. Waiting on this response. Patient aware of the issue with getting the PA.

## 2021-09-21 ENCOUNTER — Telehealth: Payer: Self-pay

## 2021-09-21 ENCOUNTER — Telehealth: Payer: Self-pay | Admitting: *Deleted

## 2021-09-21 MED ORDER — TRIAMCINOLONE ACETONIDE 0.5 % EX OINT: 1.0000 "application " | TOPICAL_OINTMENT | Freq: Two times a day (BID) | CUTANEOUS | 0 refills | Status: DC

## 2021-09-21 NOTE — Telephone Encounter (Signed)
Received orders for C pap supply. Patient is followed by pulmonology. Have faxed order to that office. No further action needed.

## 2021-09-21 NOTE — Telephone Encounter (Signed)
Adrodern approved 09/20/2021 to 09/19/2022. Advised patient about this also

## 2021-09-28 ENCOUNTER — Telehealth (INDEPENDENT_AMBULATORY_CARE_PROVIDER_SITE_OTHER): Payer: PRIVATE HEALTH INSURANCE | Admitting: Primary Care

## 2021-09-28 ENCOUNTER — Other Ambulatory Visit: Payer: Self-pay

## 2021-09-28 VITALS — Temp 101.1°F | Ht 72.0 in | Wt 354.0 lb

## 2021-09-28 DIAGNOSIS — Z20828 Contact with and (suspected) exposure to other viral communicable diseases: Secondary | ICD-10-CM | POA: Diagnosis not present

## 2021-09-28 DIAGNOSIS — J069 Acute upper respiratory infection, unspecified: Secondary | ICD-10-CM | POA: Diagnosis not present

## 2021-09-28 MED ORDER — OSELTAMIVIR PHOSPHATE 75 MG PO CAPS: 75.0000 mg | ORAL_CAPSULE | Freq: Two times a day (BID) | ORAL | 0 refills | Status: AC

## 2021-09-28 NOTE — Patient Instructions (Signed)
Start Tamiflu. Take 1 capsule by mouth twice daily for five days.  Continue Nyquil and Dayquil as needed.  It was a pleasure to see you today!

## 2021-09-28 NOTE — Progress Notes (Signed)
Patient ID: David Hansen, male    DOB: 10-05-1971, 50 y.o.   MRN: [id number]  Virtual visit completed through [employer], a video enabled telemedicine application. Due to national recommendations of social distancing due to COVID-19, a virtual visit is felt to be most appropriate for this patient at this time. Reviewed limitations, risks, security and privacy concerns of performing a virtual visit and the availability of in person appointments. I also reviewed that there may be a patient responsible charge related to this service. The patient agreed to proceed.   Patient location: home Provider location: [place] at [facility], office Persons participating in this virtual visit: patient, provider   If any vitals were documented, they were collected by patient at home unless specified below.    Temp (!) 101.1 F (38.4 C) (Temporal)   Ht 6' (1.829 m)   Wt (!) 354 lb (160.6 kg)   BMI 48.01 kg/m    CC: Fever/Chills Subjective:   HPI: David Hansen is a 50 y.o. male with a history of OSA, prediabetes presenting on 09/28/2021 for fevers and body aches.   Symptom onset this morning with headaches, body aches, and chills. He did have a fever of 101.1 this morning.   His son was diagnosed with influenza one week ago, his son tested negative for Covid-19.    He's been taking Dayquil and Nyquil with some improvement in cough. He completed an at home Covid-19 test this morning which was negative. He has not completed an influenza vaccine this season.        Relevant past medical, surgical, family and social history reviewed and updated as indicated. Interim medical history since our last visit reviewed. Allergies and medications reviewed and updated. Outpatient Medications Prior to Visit  Medication Sig Dispense Refill   blood glucose meter kit and supplies KIT Dispense based on patient and insurance preference. Use up to four times daily as directed. (FOR ICD-9 250.00,  250.01). 1 each 0   cholecalciferol (VITAMIN D3) 25 MCG (1000 UNIT) tablet Take 1,000 Units by mouth daily.     Lancets (ONETOUCH DELICA PLUS [id number]) MISC USE UP TO 4 TIMES A DAY 100 each 0   magnesium 30 MG tablet Take 30 mg by mouth 2 (two) times daily.     ONETOUCH VERIO test strip USE TO CHECK BLOOD GLUCOSE UP TO 4 TIMES A DAY 100 strip 0   triamcinolone ointment (KENALOG) 0.5 % Apply 1 application topically 2 (two) times daily. 30 g 0   vitamin B-12 (CYANOCOBALAMIN) 1000 MCG tablet Take 1,000 mcg by mouth daily.     ANDRODERM 4 MG/24HR PT24 patch APPLY 1 PATCH ONTO THE SKIN EVERY DAY (Patient not taking: Reported on 09/28/2021) 30 patch 3   No facility-administered medications prior to visit.     Per HPI unless specifically indicated in ROS section below Review of Systems  Constitutional:  Positive for chills and fever.  Respiratory:  Positive for cough.   Musculoskeletal:  Positive for myalgias.  Neurological:  Positive for headaches.  Objective:  Temp (!) 101.1 F (38.4 C) (Temporal)   Ht 6' (1.829 m)   Wt (!) 354 lb (160.6 kg)   BMI 48.01 kg/m   Wt Readings from Last 3 Encounters:  09/28/21 (!) 354 lb (160.6 kg)  09/05/21 (!) 354 lb (160.6 kg)  06/08/21 (!) 352 lb (159.7 kg)       Physical exam: General: Alert and oriented x 3, no distress, appear sickly  Pulmonary:  Speaks in complete sentences without increased work of breathing, no cough during visit.  Psychiatric: Normal mood, thought content, and behavior.     Results for orders placed or performed in visit on 09/01/21  Hematocrit  Result Value Ref Range   Hematocrit 43.8 37.5 - 51.0 %  Testosterone  Result Value Ref Range   Testosterone 509 264 - 916 ng/dL  PSA  Result Value Ref Range   Prostate Specific Ag, Serum 1.1 0.0 - 4.0 ng/mL   Assessment & Plan:   Problem List Items Addressed This Visit       Respiratory   Viral URI with cough - Primary    Suspicious for influenza, especially given  exposure from son.  He is within the window for treatment, would like to proceed with Tamiflu. Given his symptoms and exposure, this is reasonable.  Rx for Tamiflu 75 mg BID x 5 days sent to pharmacy.  Continue other conservative treatment.  Follow up PRN.      Relevant Medications   oseltamivir (TAMIFLU) 75 MG capsule   Other Visit Diagnoses     Exposure to influenza       Relevant Medications   oseltamivir (TAMIFLU) 75 MG capsule        Meds ordered this encounter  Medications   oseltamivir (TAMIFLU) 75 MG capsule    Sig: Take 1 capsule (75 mg total) by mouth 2 (two) times daily for 5 days.    Dispense:  10 capsule    Refill:  0    Order Specific Question:   Supervising Provider    Answer:   BEDSOLE, AMY E [[id number]]   No orders of the defined types were placed in this encounter.   I discussed the assessment and treatment plan with the patient. The patient was provided an opportunity to ask questions and all were answered. The patient agreed with the plan and demonstrated an understanding of the instructions. The patient was advised to call back or seek an in-person evaluation if the symptoms worsen or if the condition fails to improve as anticipated.  Follow up plan:  Start Tamiflu. Take 1 capsule by mouth twice daily for five days.  Continue Nyquil and Dayquil as needed.  It was a pleasure to see you today!   Pleas Koch, NP '

## 2021-09-28 NOTE — Assessment & Plan Note (Signed)
Suspicious for influenza, especially given exposure from son.  He is within the window for treatment, would like to proceed with Tamiflu. Given his symptoms and exposure, this is reasonable.  Rx for Tamiflu 75 mg BID x 5 days sent to pharmacy.  Continue other conservative treatment.  Follow up PRN.

## 2021-09-29 NOTE — Telephone Encounter (Signed)
Approval# [id number]

## 2021-10-12 ENCOUNTER — Ambulatory Visit: Payer: PRIVATE HEALTH INSURANCE

## 2021-10-19 ENCOUNTER — Ambulatory Visit (INDEPENDENT_AMBULATORY_CARE_PROVIDER_SITE_OTHER): Payer: PRIVATE HEALTH INSURANCE

## 2021-10-19 ENCOUNTER — Other Ambulatory Visit: Payer: Self-pay

## 2021-10-19 DIAGNOSIS — Z23 Encounter for immunization: Secondary | ICD-10-CM | POA: Diagnosis not present

## 2021-10-19 NOTE — Progress Notes (Signed)
Per orders of Alma Friendly NP, an injection of Influenza and Shingrix was given by Ophelia Shoulder. Patient tolerated injection well.

## 2021-11-01 DIAGNOSIS — E559 Vitamin D deficiency, unspecified: Secondary | ICD-10-CM

## 2021-11-01 DIAGNOSIS — E538 Deficiency of other specified B group vitamins: Secondary | ICD-10-CM

## 2021-11-02 NOTE — Telephone Encounter (Signed)
Yes, okay to schedule lab appt. Orders placed.

## 2021-11-02 NOTE — Telephone Encounter (Signed)
Looks like last labs were done 7/22. If ok to make lab appointment let me know I will call and set up.

## 2021-11-07 ENCOUNTER — Encounter: Payer: Self-pay | Admitting: Urology

## 2021-11-07 ENCOUNTER — Other Ambulatory Visit: Payer: Self-pay | Admitting: Urology

## 2021-12-13 DIAGNOSIS — R2689 Other abnormalities of gait and mobility: Secondary | ICD-10-CM | POA: Diagnosis not present

## 2021-12-13 DIAGNOSIS — M79604 Pain in right leg: Secondary | ICD-10-CM | POA: Diagnosis not present

## 2021-12-14 ENCOUNTER — Encounter: Payer: Self-pay | Admitting: Urology

## 2021-12-20 ENCOUNTER — Encounter: Payer: Self-pay | Admitting: Urology

## 2021-12-29 DIAGNOSIS — M79604 Pain in right leg: Secondary | ICD-10-CM | POA: Diagnosis not present

## 2022-01-04 DIAGNOSIS — M79604 Pain in right leg: Secondary | ICD-10-CM | POA: Diagnosis not present

## 2022-01-18 DIAGNOSIS — R2689 Other abnormalities of gait and mobility: Secondary | ICD-10-CM | POA: Diagnosis not present

## 2022-01-18 DIAGNOSIS — M79604 Pain in right leg: Secondary | ICD-10-CM | POA: Diagnosis not present

## 2022-03-06 ENCOUNTER — Other Ambulatory Visit: Payer: PRIVATE HEALTH INSURANCE

## 2022-03-09 ENCOUNTER — Ambulatory Visit: Payer: PRIVATE HEALTH INSURANCE | Admitting: Urology

## 2022-03-10 ENCOUNTER — Ambulatory Visit: Payer: PRIVATE HEALTH INSURANCE | Admitting: Urology

## 2022-03-28 ENCOUNTER — Ambulatory Visit (INDEPENDENT_AMBULATORY_CARE_PROVIDER_SITE_OTHER): Payer: BC Managed Care – PPO | Admitting: Family Medicine

## 2022-03-28 VITALS — BP 110/82 | HR 70 | Temp 98.0°F | Ht 72.0 in | Wt 361.1 lb

## 2022-03-28 DIAGNOSIS — M10471 Other secondary gout, right ankle and foot: Secondary | ICD-10-CM

## 2022-03-28 DIAGNOSIS — M109 Gout, unspecified: Secondary | ICD-10-CM | POA: Insufficient documentation

## 2022-03-28 DIAGNOSIS — M10071 Idiopathic gout, right ankle and foot: Secondary | ICD-10-CM

## 2022-03-28 MED ORDER — INDOMETHACIN 50 MG PO CAPS: 50.0000 mg | ORAL_CAPSULE | Freq: Three times a day (TID) | ORAL | 0 refills | Status: DC

## 2022-03-28 NOTE — Patient Instructions (Addendum)
Take indomethacin 50 mg three times daily ? ?If no improvement in 2 days - call and will send in steroids ? ?If worsening, fevers/chills, worsening redness or pain - call immediately ? ?If effective - and symptoms recur take at first sign of symptoms ? ?Avoiding future gout attacks ?Eat a low-purine diet. Avoid foods and drinks such as: ?Liver. ?Kidney. ?Anchovies. ?Asparagus. ?Herring. ?Mushrooms. ?Mussels. ?Beer. ?Stay at a healthy weight. If you want to lose weight, talk with your doctor. Do not lose weight too fast. ?Start or continue an exercise plan as told by your doctor. ?Eating and drinking ?Avoid drinks sweetened by fructose. ?Drink enough fluids to keep your pee (urine) pale yellow. ?If you drink alcohol: ?Limit how much you have to: ?0-1 drink a day for women who are not pregnant. ?0-2 drinks a day for men. ?Know how much alcohol is in a drink. In the [place] one drink equals one 12 oz bottle of beer (355 mL), one 5 oz glass of wine (148 mL), or one 1? oz glass of hard liquor (44 mL). ?

## 2022-03-28 NOTE — Assessment & Plan Note (Signed)
Previous episode responded to steroid Dosepak.  Suspect this is gout, he is not sure if the location is the same as prior episode.  Discussed getting uric acid with the next lab draw he will schedule his annual with PCP for the summer or sooner if wanting to be tested sooner.  At this time discussed trial of indomethacin, however he does note some intolerance to NSAIDs in the past.  If he does not notice improvement in 1 to 2 days he will call back for steroid course. ?

## 2022-03-28 NOTE — Progress Notes (Signed)
? ?Subjective:  ? ?  ?David Hansen is a 51 y.o. male presenting for Toe Pain (R big toe x 2 days post hiking on Saturday. First episode was in December ) ?  ? ? ?HPI ? ?#Great toe pain ?- in December went to [facility] for urgent visit ?- had XR done - no arthritis or fracture ?- discussed it could be gout ?- treated with steroids - methlprednisolone 4 mg dose pack with improvement ? ?- this episode   ?- started Sunday morning ?- was camping/hiking Saturday  ?- woke up with throbbing pain, sensitive to the touch ?- treatment: ibuprofen w/ improvement ?- yesterday walked 1 mile at lunchtime ?- also had some calf stiffness/pain ?- woke up today and the weight of blanket was painful ?- pain is in the first metatarsal - phallenx joint ?- redness started yesterday ? ?Not sure which foot he went to [facility] with ? ?No fever/chills ? ?Previous episode also occurred after all day event -- wearing hiking boots then and walking 18000 steps ? ?Diet: limits red meat, little alcohol ? ?Has taken ibuprofen 600-800 once daily ? ? ? ?Review of Systems ? ? ?Social History  ? ?Tobacco Use  ?Smoking Status Never  ?Smokeless Tobacco Never  ? ? ? ?   ?Objective:  ?  ?BP Readings from Last 3 Encounters:  ?03/28/22 110/82  ?09/05/21 118/76  ?06/08/21 118/74  ? ?Wt Readings from Last 3 Encounters:  ?03/28/22 (!) 361 lb 2 oz (163.8 kg)  ?09/28/21 (!) 354 lb (160.6 kg)  ?09/05/21 (!) 354 lb (160.6 kg)  ? ? ?BP 110/82   Pulse 70   Temp 98 ?F (36.7 ?C) (Oral)   Ht 6' (1.829 m)   Wt (!) 361 lb 2 oz (163.8 kg)   SpO2 97%   BMI 48.98 kg/m?  ? ? ?Physical Exam ?Constitutional:   ?   Appearance: Normal appearance. He is obese. He is not ill-appearing or diaphoretic.  ?HENT:  ?   Right Ear: External ear normal.  ?   Left Ear: External ear normal.  ?   Nose: Nose normal.  ?Eyes:  ?   General: No scleral icterus. ?   Extraocular Movements: Extraocular movements intact.  ?   Conjunctiva/sclera: Conjunctivae normal.  ?Cardiovascular:   ?   Rate and Rhythm: Normal rate.  ?Pulmonary:  ?   Effort: Pulmonary effort is normal.  ?Musculoskeletal:  ?   Cervical back: Neck supple.  ?   Comments: Right foot: Faint erythema, warmth, and ttp along the first MTP joint  ?Skin: ?   General: Skin is warm and dry.  ?Neurological:  ?   Mental Status: He is alert. Mental status is at baseline.  ?Psychiatric:     ?   Mood and Affect: Mood normal.  ? ? ? ? ? ?   ?Assessment & Plan:  ? ?Problem List Items Addressed This Visit   ? ?  ? Other  ? Gout - Primary  ?  Previous episode responded to steroid Dosepak.  Suspect this is gout, he is not sure if the location is the same as prior episode.  Discussed getting uric acid with the next lab draw he will schedule his annual with PCP for the summer or sooner if wanting to be tested sooner.  At this time discussed trial of indomethacin, however he does note some intolerance to NSAIDs in the past.  If he does not notice improvement in 1 to 2 days he  will call back for steroid course. ? ?  ?  ? Relevant Medications  ? indomethacin (INDOCIN) 50 MG capsule  ? ? ? ?Return in about 3 months (around 06/27/2022) for for annual with pcp. ? ?Lesleigh Noe, MD ? ? ? ?

## 2022-04-19 DIAGNOSIS — G4733 Obstructive sleep apnea (adult) (pediatric): Secondary | ICD-10-CM | POA: Diagnosis not present

## 2022-04-24 ENCOUNTER — Telehealth: Payer: Self-pay | Admitting: Internal Medicine

## 2022-04-24 NOTE — Telephone Encounter (Signed)
Patient is aware that we are able to access data via airview.  He is aware to keep scheduled visit for 06/23/22. He voiced understanding and had no further questions.  Nothing further needed.

## 2022-05-09 ENCOUNTER — Ambulatory Visit (INDEPENDENT_AMBULATORY_CARE_PROVIDER_SITE_OTHER): Payer: BC Managed Care – PPO | Admitting: Primary Care

## 2022-05-09 ENCOUNTER — Encounter: Payer: Self-pay | Admitting: Primary Care

## 2022-05-09 VITALS — BP 124/72 | HR 68 | Temp 98.6°F | Ht 72.0 in | Wt 360.0 lb

## 2022-05-09 DIAGNOSIS — G4733 Obstructive sleep apnea (adult) (pediatric): Secondary | ICD-10-CM

## 2022-05-09 DIAGNOSIS — R5383 Other fatigue: Secondary | ICD-10-CM

## 2022-05-09 DIAGNOSIS — L301 Dyshidrosis [pompholyx]: Secondary | ICD-10-CM | POA: Diagnosis not present

## 2022-05-09 DIAGNOSIS — Z6841 Body Mass Index (BMI) 40.0 and over, adult: Secondary | ICD-10-CM | POA: Diagnosis not present

## 2022-05-09 DIAGNOSIS — G8929 Other chronic pain: Secondary | ICD-10-CM | POA: Diagnosis not present

## 2022-05-09 DIAGNOSIS — Z114 Encounter for screening for human immunodeficiency virus [HIV]: Secondary | ICD-10-CM

## 2022-05-09 DIAGNOSIS — M79673 Pain in unspecified foot: Secondary | ICD-10-CM

## 2022-05-09 DIAGNOSIS — Z1159 Encounter for screening for other viral diseases: Secondary | ICD-10-CM | POA: Diagnosis not present

## 2022-05-09 DIAGNOSIS — Z0000 Encounter for general adult medical examination without abnormal findings: Secondary | ICD-10-CM | POA: Diagnosis not present

## 2022-05-09 DIAGNOSIS — R7303 Prediabetes: Secondary | ICD-10-CM

## 2022-05-09 DIAGNOSIS — E669 Obesity, unspecified: Secondary | ICD-10-CM

## 2022-05-09 LAB — CBC
HCT: 41.1 % (ref 39.0–52.0)
Hemoglobin: 13.8 g/dL (ref 13.0–17.0)
MCHC: 33.6 g/dL (ref 30.0–36.0)
MCV: 87.7 fl (ref 78.0–100.0)
Platelets: 259 10*3/uL (ref 150.0–400.0)
RBC: 4.69 Mil/uL (ref 4.22–5.81)
RDW: 13.7 % (ref 11.5–15.5)
WBC: 4.7 10*3/uL (ref 4.0–10.5)

## 2022-05-09 LAB — COMPREHENSIVE METABOLIC PANEL
ALT: 21 U/L (ref 0–53)
AST: 18 U/L (ref 0–37)
Albumin: 4.1 g/dL (ref 3.5–5.2)
Alkaline Phosphatase: 37 U/L — ABNORMAL LOW (ref 39–117)
BUN: 16 mg/dL (ref 6–23)
CO2: 28 mEq/L (ref 19–32)
Calcium: 9.3 mg/dL (ref 8.4–10.5)
Chloride: 102 mEq/L (ref 96–112)
Creatinine, Ser: 0.95 mg/dL (ref 0.40–1.50)
GFR: 93.06 mL/min (ref >60.00)
Glucose, Bld: 93 mg/dL (ref 70–99)
Potassium: 4.3 mEq/L (ref 3.5–5.1)
Sodium: 137 mEq/L (ref 135–145)
Total Bilirubin: 0.6 mg/dL (ref 0.2–1.2)
Total Protein: 6.7 g/dL (ref 6.0–8.3)

## 2022-05-09 LAB — LIPID PANEL
Cholesterol: 176 mg/dL (ref 0–200)
HDL: 45.7 mg/dL (ref >39.00)
LDL Cholesterol: 110 mg/dL — ABNORMAL HIGH (ref 0–99)
NonHDL: 129.95
Total CHOL/HDL Ratio: 4
Triglycerides: 101 mg/dL (ref 0.0–149.0)
VLDL: 20.2 mg/dL (ref 0.0–40.0)

## 2022-05-09 LAB — URIC ACID: Uric Acid, Serum: 8.2 mg/dL — ABNORMAL HIGH (ref 4.0–7.8)

## 2022-05-09 LAB — HEMOGLOBIN A1C: Hgb A1c MFr Bld: 6.2 % (ref 4.6–6.5)

## 2022-05-09 LAB — VITAMIN D 25 HYDROXY (VIT D DEFICIENCY, FRACTURES): VITD: 51.41 ng/mL (ref 30.00–100.00)

## 2022-05-09 LAB — VITAMIN B12: Vitamin B-12: 305 pg/mL (ref 211–911)

## 2022-05-09 NOTE — Assessment & Plan Note (Signed)
Controlled. Typically occurs during Winter months.   Continue OTC treatment.

## 2022-05-09 NOTE — Assessment & Plan Note (Signed)
Discussed the importance of a healthy diet and regular exercise in order for weight loss, and to reduce the risk of further co-morbidity. ? ?Repeat A1C pending. ?

## 2022-05-09 NOTE — Assessment & Plan Note (Signed)
Continued. Evaluated by Urology, no longer on testosterone patches.  Continue vitamin D. Not currently on b12 supplement.  Repeat labs pending.

## 2022-05-09 NOTE — Patient Instructions (Signed)
Stop by the lab prior to leaving today. I will notify you of your results once received.   It was a pleasure to see you today!  Preventive Care 40-51 Years Old, Male Preventive care refers to lifestyle choices and visits with your health care provider that can promote health and wellness. Preventive care visits are also called wellness exams. What can I expect for my preventive care visit? Counseling During your preventive care visit, your health care provider may ask about your: Medical history, including: Past medical problems. Family medical history. Current health, including: Emotional well-being. Home life and relationship well-being. Sexual activity. Lifestyle, including: Alcohol, nicotine or tobacco, and drug use. Access to firearms. Diet, exercise, and sleep habits. Safety issues such as seatbelt and bike helmet use. Sunscreen use. Work and work environment. Physical exam Your health care provider will check your: Height and weight. These may be used to calculate your BMI (body mass index). BMI is a measurement that tells if you are at a healthy weight. Waist circumference. This measures the distance around your waistline. This measurement also tells if you are at a healthy weight and may help predict your risk of certain diseases, such as type 2 diabetes and high blood pressure. Heart rate and blood pressure. Body temperature. Skin for abnormal spots. What immunizations do I need?  Vaccines are usually given at various ages, according to a schedule. Your health care provider will recommend vaccines for you based on your age, medical history, and lifestyle or other factors, such as travel or where you work. What tests do I need? Screening Your health care provider may recommend screening tests for certain conditions. This may include: Lipid and cholesterol levels. Diabetes screening. This is done by checking your blood sugar (glucose) after you have not eaten for a while  (fasting). Hepatitis B test. Hepatitis C test. HIV (human immunodeficiency virus) test. STI (sexually transmitted infection) testing, if you are at risk. Lung cancer screening. Prostate cancer screening. Colorectal cancer screening. Talk with your health care provider about your test results, treatment options, and if necessary, the need for more tests. Follow these instructions at home: Eating and drinking  Eat a diet that includes fresh fruits and vegetables, whole grains, lean protein, and low-fat dairy products. Take vitamin and mineral supplements as recommended by your health care provider. Do not drink alcohol if your health care provider tells you not to drink. If you drink alcohol: Limit how much you have to 0-2 drinks a day. Know how much alcohol is in your drink. In the [place] one drink equals one 12 oz bottle of beer (355 mL), one 5 oz glass of wine (148 mL), or one 1 oz glass of hard liquor (44 mL). Lifestyle Brush your teeth every morning and night with fluoride toothpaste. Floss one time each day. Exercise for at least 30 minutes 5 or more days each week. Do not use any products that contain nicotine or tobacco. These products include cigarettes, chewing tobacco, and vaping devices, such as e-cigarettes. If you need help quitting, ask your health care provider. Do not use drugs. If you are sexually active, practice safe sex. Use a condom or other form of protection to prevent STIs. Take aspirin only as told by your health care provider. Make sure that you understand how much to take and what form to take. Work with your health care provider to find out whether it is safe and beneficial for you to take aspirin daily. Find healthy ways to manage   stress, such as: Meditation, yoga, or listening to music. Journaling. Talking to a trusted person. Spending time with friends and family. Minimize exposure to UV radiation to reduce your risk of skin cancer. Safety Always wear  your seat belt while driving or riding in a vehicle. Do not drive: If you have been drinking alcohol. Do not ride with someone who has been drinking. When you are tired or distracted. While texting. If you have been using any mind-altering substances or drugs. Wear a helmet and other protective equipment during sports activities. If you have firearms in your house, make sure you follow all gun safety procedures. What's next? Go to your health care provider once a year for an annual wellness visit. Ask your health care provider how often you should have your eyes and teeth checked. Stay up to date on all vaccines. This information is not intended to replace advice given to you by your health care provider. Make sure you discuss any questions you have with your health care provider. Document Revised: 05/18/2021 Document Reviewed: 05/18/2021 Elsevier Patient Education  2023 [employer] Inc.  

## 2022-05-09 NOTE — Assessment & Plan Note (Signed)
Compliant to CPAP nightly, continue same.  

## 2022-05-09 NOTE — Progress Notes (Signed)
Subjective:    Patient ID: David Hansen, male    DOB: [birth year], 51 y.o.   MRN: [id number]  HPI  David KAID SEEBERGER is a very pleasant 51 y.o. male who presents today for complete physical and follow up of chronic conditions.  Immunizations: -Tetanus: 2016 -Influenza: Completed last season  -Covid-19: 2 vaccines  -Shingles: Completed Shingrix series  Diet: Fair diet.  Exercise: Active at work, walks extra 2 miles daily  Eye exam: Completes annually  Dental exam: Completes semi-annually   Colonoscopy: Completed in 2019, due 2024  PSA: UTD  BP Readings from Last 3 Encounters:  05/09/22 124/72  03/28/22 110/82  09/05/21 118/76    Wt Readings from Last 3 Encounters:  05/09/22 (!) 360 lb (163.3 kg)  03/28/22 (!) 361 lb 2 oz (163.8 kg)  09/28/21 (!) 354 lb (160.6 kg)        Review of Systems  Constitutional:  Negative for unexpected weight change.  HENT:  Negative for rhinorrhea.   Respiratory:  Negative for cough and shortness of breath.   Cardiovascular:  Negative for chest pain.  Gastrointestinal:  Negative for constipation and diarrhea.  Genitourinary:  Negative for difficulty urinating.  Musculoskeletal:  Negative for arthralgias and myalgias.  Skin:  Negative for rash.  Allergic/Immunologic: Negative for environmental allergies.  Neurological:  Negative for dizziness and headaches.  Psychiatric/Behavioral:  The patient is not nervous/anxious.         Past Medical History:  Diagnosis Date   Acute back pain 07/04/2012   Acute neck pain 07/04/2012   Chicken pox    Colon polyp 07/27/2011   Depression    Obesity    Sleep apnea 2008   CPAP    Social History   Socioeconomic History   Marital status: Married    Spouse name: Not on file   Number of children: 2   Years of education: Not on file   Highest education level: Not on file  Occupational History   Occupation: Land  Tobacco Use   Smoking status: Never   Smokeless  tobacco: Never  Substance and Sexual Activity   Alcohol use: Yes    Alcohol/week: 0.0 standard drinks    Comment: rarely   Drug use: No   Sexual activity: Not on file  Other Topics Concern   Not on file  Social History Narrative   Regular exercise yes 2-3 times a week...walking      Diet: fruits and veggies,lean protein, water   Social Determinants of Health   Financial Resource Strain: Not on file  Food Insecurity: Not on file  Transportation Needs: Not on file  Physical Activity: Not on file  Stress: Not on file  Social Connections: Not on file  Intimate Partner Violence: Not on file    Past Surgical History:  Procedure Laterality Date   COLONOSCOPY  07/27/2011   Dr Fuller Plan   COLONOSCOPY WITH PROPOFOL N/A 07/24/2018   Procedure: COLONOSCOPY WITH PROPOFOL;  Surgeon: Robert Bellow, MD;  Location: [facility] ENDOSCOPY;  Service: Endoscopy;  Laterality: N/A;   KNEE ARTHROSCOPY W/ ACL RECONSTRUCTION AND PATELLA GRAFT Right 1998   [facility] in [place]  Right knee    Family History  Problem Relation Age of Onset   Cancer Father 20       colon cancer    No Known Allergies  Current Outpatient Medications on File Prior to Visit  Medication Sig Dispense Refill   blood glucose meter kit and supplies KIT Dispense based on  patient and insurance preference. Use up to four times daily as directed. (FOR ICD-9 250.00, 250.01). 1 each 0   cholecalciferol (VITAMIN D3) 25 MCG (1000 UNIT) tablet Take 1,000 Units by mouth daily.     indomethacin (INDOCIN) 50 MG capsule Take 1 capsule (50 mg total) by mouth 3 (three) times daily with meals. 30 capsule 0   Lancets (ONETOUCH DELICA PLUS [id number]) MISC USE UP TO 4 TIMES A DAY 100 each 0   magnesium 30 MG tablet Take 30 mg by mouth 2 (two) times daily.     ONETOUCH VERIO test strip USE TO CHECK BLOOD GLUCOSE UP TO 4 TIMES A DAY 100 strip 0   No current facility-administered medications on file prior to visit.    BP 124/72   Pulse 68    Temp 98.6 F (37 C) (Oral)   Ht 6' (1.829 m)   Wt (!) 360 lb (163.3 kg)   SpO2 97%   BMI 48.82 kg/m  Objective:   Physical Exam HENT:     Right Ear: Tympanic membrane and ear canal normal.     Left Ear: Tympanic membrane and ear canal normal.     Nose: Nose normal.     Right Sinus: No maxillary sinus tenderness or frontal sinus tenderness.     Left Sinus: No maxillary sinus tenderness or frontal sinus tenderness.  Eyes:     Conjunctiva/sclera: Conjunctivae normal.  Neck:     Thyroid: No thyromegaly.     Vascular: No carotid bruit.  Cardiovascular:     Rate and Rhythm: Normal rate and regular rhythm.     Heart sounds: Normal heart sounds.  Pulmonary:     Effort: Pulmonary effort is normal.     Breath sounds: Normal breath sounds. No wheezing or rales.  Abdominal:     General: Bowel sounds are normal.     Palpations: Abdomen is soft.     Tenderness: There is no abdominal tenderness.  Musculoskeletal:        General: Normal range of motion.     Cervical back: Neck supple.  Skin:    General: Skin is warm and dry.  Neurological:     Mental Status: He is alert and oriented to person, place, and time.     Cranial Nerves: No cranial nerve deficit.     Deep Tendon Reflexes: Reflexes are normal and symmetric.  Psychiatric:        Mood and Affect: Mood normal.          Assessment & Plan:

## 2022-05-09 NOTE — Assessment & Plan Note (Signed)
Immunizations UTD. Colonoscopy UTD, due 2024. PSA reviewed from Urology in September 2022.  Discussed the importance of a healthy diet and regular exercise in order for weight loss, and to reduce the risk of further co-morbidity.  Exam stable. Labs pending.  Follow up in 1 year for repeat physical.

## 2022-05-10 LAB — HEPATITIS C ANTIBODY
Hepatitis C Ab: NONREACTIVE
SIGNAL TO CUT-OFF: 0.14 (ref <1.00)

## 2022-05-10 LAB — HIV ANTIBODY (ROUTINE TESTING W REFLEX): HIV 1&2 Ab, 4th Generation: NONREACTIVE

## 2022-05-20 DIAGNOSIS — G4733 Obstructive sleep apnea (adult) (pediatric): Secondary | ICD-10-CM | POA: Diagnosis not present

## 2022-06-19 DIAGNOSIS — G4733 Obstructive sleep apnea (adult) (pediatric): Secondary | ICD-10-CM | POA: Diagnosis not present

## 2022-06-23 ENCOUNTER — Ambulatory Visit: Payer: PRIVATE HEALTH INSURANCE | Admitting: Adult Health

## 2022-06-27 ENCOUNTER — Ambulatory Visit (INDEPENDENT_AMBULATORY_CARE_PROVIDER_SITE_OTHER): Payer: BC Managed Care – PPO | Admitting: Adult Health

## 2022-06-27 ENCOUNTER — Encounter: Payer: Self-pay | Admitting: Adult Health

## 2022-06-27 VITALS — BP 112/50 | HR 97 | Temp 98.2°F | Ht 72.0 in | Wt 365.0 lb

## 2022-06-27 DIAGNOSIS — G4733 Obstructive sleep apnea (adult) (pediatric): Secondary | ICD-10-CM | POA: Diagnosis not present

## 2022-06-27 NOTE — Patient Instructions (Addendum)
Change CPAP pressure to Auto CPAP 10 to 20cmH2O.  CPAP download 1 month .  Keep up good work.  Work on healthy weight  Continue on CPAP At bedtime   Do not drive if sleepy  Follow up with Dr. Mortimer Fries or Shammond Arave NP in 1 year and As needed  (may be video)

## 2022-06-27 NOTE — Assessment & Plan Note (Signed)
Excellent control compliance on CPAP.  We will adjust CPAP pressure for comfort Change CPAP pressure to 10 to 20 cm H2O.  Download in 1 month  Plan  Patient Instructions  Change CPAP pressure to Auto CPAP 10 to 20cmH2O.  CPAP download 1 month .  Keep up good work.  Work on healthy weight  Continue on CPAP At bedtime   Do not drive if sleepy  Follow up with Dr. Mortimer Fries or Aaleeyah Bias NP in 1 year and As needed  (may be video)

## 2022-06-27 NOTE — Progress Notes (Signed)
@Patient  ID: David Hansen, male    DOB: 11-27-1971, 51 y.o.   MRN: [id number]  Chief Complaint  Patient presents with   Follow-up    Referring provider: Pleas Koch, NP  HPI: 51 year old male followed for obstructive sleep apnea  TEST/EVENTS :  CPAP download 09/16/2018-10/15/2018>> raw data personally reviewed.  Usage is 29/30 days.  Average usage on days used is 6 hours 20 minutes.  Set pressure of 5-20.  Median pressure is 8, 95th percentile pressure 10, maximum pressure 11, leaks are within normal limits.  Residual AHI is 0.9.  Overall this shows very good compliance with CPAP with excellent control of obstructive sleep apnea.  06/27/2022 Follow up : OSA  Patient presents for a 1 year follow-up.  Patient has underlying sleep apnea.  Patient says he wears his CPAP every night cannot sleep without it.  CPAP download shows excellent compliance with daily average usage at 7 hours.  Patient is on auto CPAP 5 to 20 cm of H2O.  AHI 1.8.  Daily average pressure at 10.7 cm H2O. Patient says at times he feels like his CPAP pressure is not enough. He feels that he is more rested when he wears his CPAP and benefits from CPAP. Use nasal mask.  Has travel mini and goes camping.    No Known Allergies  Immunization History  Administered Date(s) Administered   Influenza Whole 09/13/2010   Influenza,inj,Quad PF,6+ Mos 09/05/2013, 09/16/2015, 12/27/2016, 10/09/2019, 10/19/2021   Influenza-Unspecified 09/03/2014   Moderna Sars-Covid-2 Vaccination 02/05/2020, 03/04/2020   Tdap 12/27/2012, 09/16/2015   Zoster Recombinat (Shingrix) 06/22/2021, 10/19/2021    Past Medical History:  Diagnosis Date   Acute back pain 07/04/2012   Acute neck pain 07/04/2012   Chicken pox    Colon polyp 07/27/2011   Depression    Obesity    Sleep apnea 2008   CPAP    Tobacco History: Social History   Tobacco Use  Smoking Status Never  Smokeless Tobacco Never   Counseling given: Not  Answered   Outpatient Medications Prior to Visit  Medication Sig Dispense Refill   blood glucose meter kit and supplies KIT Dispense based on patient and insurance preference. Use up to four times daily as directed. (FOR ICD-9 250.00, 250.01). 1 each 0   cholecalciferol (VITAMIN D3) 25 MCG (1000 UNIT) tablet Take 1,000 Units by mouth daily.     indomethacin (INDOCIN) 50 MG capsule Take 1 capsule (50 mg total) by mouth 3 (three) times daily with meals. 30 capsule 0   Lancets (ONETOUCH DELICA PLUS [id number]) MISC USE UP TO 4 TIMES A DAY 100 each 0   magnesium 30 MG tablet Take 30 mg by mouth 2 (two) times daily.     ONETOUCH VERIO test strip USE TO CHECK BLOOD GLUCOSE UP TO 4 TIMES A DAY 100 strip 0   No facility-administered medications prior to visit.     Review of Systems:   Constitutional:   No  weight loss, night sweats,  Fevers, chills, fatigue, or  lassitude.  HEENT:   No headaches,  Difficulty swallowing,  Tooth/dental problems, or  Sore throat,                No sneezing, itching, ear ache, nasal congestion, post nasal drip,   CV:  No chest pain,  Orthopnea, PND, swelling in lower extremities, anasarca, dizziness, palpitations, syncope.   GI  No heartburn, indigestion, abdominal pain, nausea, vomiting, diarrhea, change in bowel habits, loss of appetite, bloody  stools.   Resp: No shortness of breath with exertion or at rest.  No excess mucus, no productive cough,  No non-productive cough,  No coughing up of blood.  No change in color of mucus.  No wheezing.  No chest wall deformity  Skin: no rash or lesions.  GU: no dysuria, change in color of urine, no urgency or frequency.  No flank pain, no hematuria   MS:  No joint pain or swelling.  No decreased range of motion.  No back pain.    Physical Exam  BP (!) 112/50 (BP Location: Left Arm, Patient Position: Sitting, Cuff Size: Large)   Pulse 97   Temp 98.2 F (36.8 C) (Oral)   Ht 6' (1.829 m)   Wt (!) 365 lb (165.6 kg)    SpO2 97%   BMI 49.50 kg/m   GEN: A/Ox3; pleasant , NAD, well nourished    HEENT:  [place]/AT,  EACs-clear, TMs-wnl, NOSE-clear, THROAT-clear, no lesions, no postnasal drip or exudate noted.   NECK:  Supple w/ fair ROM; no JVD; normal carotid impulses w/o bruits; no thyromegaly or nodules palpated; no lymphadenopathy.    RESP  Clear  P & A; w/o, wheezes/ rales/ or rhonchi. no accessory muscle use, no dullness to percussion  CARD:  RRR, no m/r/g, no peripheral edema, pulses intact, no cyanosis or clubbing.  GI:   Soft & nt; nml bowel sounds; no organomegaly or masses detected.   Musco: Warm bil, no deformities or joint swelling noted.   Neuro: alert, no focal deficits noted.    Skin: Warm, no lesions or rashes    Lab Results:  CBC    Component Value Date/Time   WBC 4.7 05/09/2022 1109   RBC 4.69 05/09/2022 1109   HGB 13.8 05/09/2022 1109   HCT 41.1 05/09/2022 1109   HCT 43.8 09/01/2021 0915   PLT 259.0 05/09/2022 1109   MCV 87.7 05/09/2022 1109   MCHC 33.6 05/09/2022 1109   RDW 13.7 05/09/2022 1109   LYMPHSABS 1.3 05/22/2018 0831   MONOABS 0.5 05/22/2018 0831   EOSABS 0.2 05/22/2018 0831   BASOSABS 0.0 05/22/2018 0831    BMET    Component Value Date/Time   NA 137 05/09/2022 1109   K 4.3 05/09/2022 1109   CL 102 05/09/2022 1109   CO2 28 05/09/2022 1109   GLUCOSE 93 05/09/2022 1109   BUN 16 05/09/2022 1109   CREATININE 0.95 05/09/2022 1109   CREATININE 1.10 06/03/2014 1914   CALCIUM 9.3 05/09/2022 1109   GFRNONAA >60 06/03/2014 1914   GFRAA >60 06/03/2014 1914    BNP No results found for: "BNP"  ProBNP No results found for: "PROBNP"  Imaging: No results found.        No data to display          No results found for: "NITRICOXIDE"      Assessment & Plan:   Obstructive sleep apnea Excellent control compliance on CPAP.  We will adjust CPAP pressure for comfort Change CPAP pressure to 10 to 20 cm H2O.  Download in 1 month  Plan  Patient  Instructions  Change CPAP pressure to Auto CPAP 10 to 20cmH2O.  CPAP download 1 month .  Keep up good work.  Work on healthy weight  Continue on CPAP At bedtime   Do not drive if sleepy  Follow up with Dr. Mortimer Fries or Joeanna Howdyshell NP in 1 year and As needed  (may be video)        Zaniel Marineau  Trinty Marken, NP 06/27/2022

## 2022-07-20 DIAGNOSIS — G4733 Obstructive sleep apnea (adult) (pediatric): Secondary | ICD-10-CM | POA: Diagnosis not present

## 2022-08-08 ENCOUNTER — Telehealth: Payer: Self-pay | Admitting: Primary Care

## 2022-08-08 NOTE — Telephone Encounter (Signed)
Patient was seen for physical on 05/09/22. He wanted to know why was HIV screening/testing was suggested to him at that visit by Anda Kraft?

## 2022-08-08 NOTE — Telephone Encounter (Signed)
Called patient let him know that it was part of preventative screening. No further questions at this time.

## 2022-08-20 DIAGNOSIS — G4733 Obstructive sleep apnea (adult) (pediatric): Secondary | ICD-10-CM | POA: Diagnosis not present

## 2022-08-22 ENCOUNTER — Ambulatory Visit: Payer: PRIVATE HEALTH INSURANCE | Admitting: Primary Care

## 2022-09-19 DIAGNOSIS — G4733 Obstructive sleep apnea (adult) (pediatric): Secondary | ICD-10-CM | POA: Diagnosis not present

## 2022-09-20 DIAGNOSIS — G4733 Obstructive sleep apnea (adult) (pediatric): Secondary | ICD-10-CM | POA: Diagnosis not present

## 2022-10-05 ENCOUNTER — Ambulatory Visit (INDEPENDENT_AMBULATORY_CARE_PROVIDER_SITE_OTHER): Payer: BC Managed Care – PPO

## 2022-10-05 ENCOUNTER — Encounter: Payer: Self-pay | Admitting: Podiatry

## 2022-10-05 ENCOUNTER — Ambulatory Visit (INDEPENDENT_AMBULATORY_CARE_PROVIDER_SITE_OTHER): Payer: BC Managed Care – PPO | Admitting: Podiatry

## 2022-10-05 DIAGNOSIS — M722 Plantar fascial fibromatosis: Secondary | ICD-10-CM

## 2022-10-05 NOTE — Progress Notes (Signed)
Subjective:  Patient ID: David Hansen, male    DOB: 1971/07/28,  MRN: [id number]  Chief Complaint  Patient presents with   Foot Pain    "I have heel pain and pain on the outside of my foot." N - heel pain L - plantar and lateral left D - two months O - off and on C - sharp pain side of foot, heel - constant pain A - walking T - stretches, Ibuprofen     51 y.o. male presents with the above complaint.  Patient presents with left heel pain.  Patient states the pain for touch is progressive gotten worse hurts with left plantar lateral foot.  Is been on for 2 months off-and-on.  Stretching and ibuprofen has helped some.  She went to get it eval she has not seen anyone else prior to seeing me.   Review of Systems: Negative except as noted in the HPI. Denies N/V/F/Ch.  Past Medical History:  Diagnosis Date   Acute back pain 07/04/2012   Acute neck pain 07/04/2012   Chicken pox    Colon polyp 07/27/2011   Depression    Obesity    Sleep apnea 2008   CPAP    Current Outpatient Medications:    blood glucose meter kit and supplies KIT, Dispense based on patient and insurance preference. Use up to four times daily as directed. (FOR ICD-9 250.00, 250.01)., Disp: 1 each, Rfl: 0   cholecalciferol (VITAMIN D3) 25 MCG (1000 UNIT) tablet, Take 1,000 Units by mouth daily., Disp: , Rfl:    indomethacin (INDOCIN) 50 MG capsule, Take 1 capsule (50 mg total) by mouth 3 (three) times daily with meals., Disp: 30 capsule, Rfl: 0   Lancets (ONETOUCH DELICA PLUS [id number]) MISC, USE UP TO 4 TIMES A DAY, Disp: 100 each, Rfl: 0   magnesium 30 MG tablet, Take 30 mg by mouth 2 (two) times daily., Disp: , Rfl:    ONETOUCH VERIO test strip, USE TO CHECK BLOOD GLUCOSE UP TO 4 TIMES A DAY, Disp: 100 strip, Rfl: 0  Social History   Tobacco Use  Smoking Status Never  Smokeless Tobacco Never    No Known Allergies Objective:  There were no vitals filed for this visit. There is no height or weight on  file to calculate BMI. Constitutional Well developed. Well nourished.  Vascular Dorsalis pedis pulses palpable bilaterally. Posterior tibial pulses palpable bilaterally. Capillary refill normal to all digits.  No cyanosis or clubbing noted. Pedal hair growth normal.  Neurologic Normal speech. Oriented to person, place, and time. Epicritic sensation to light touch grossly present bilaterally.  Dermatologic Nails well groomed and normal in appearance. No open wounds. No skin lesions.  Orthopedic: Normal joint ROM without pain or crepitus bilaterally. No visible deformities. Tender to palpation at the calcaneal tuber left. No pain with calcaneal squeeze left. Ankle ROM diminished range of motion left. Silfverskiold Test: positive left.   Radiographs: Taken and reviewed. No acute fractures or dislocations. No evidence of stress fracture.  Plantar heel spur absent. Posterior heel spur absent.  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  No other abnormalities noted.  Pes planovalgus foot structure noted  Assessment:   1. Plantar fasciitis of left foot    Plan:  Patient was evaluated and treated and all questions answered.  Plantar Fasciitis, left - XR reviewed as above.  - Educated on icing and stretching. Instructions given.  - Injection delivered to the plantar fascia as below. - DME: Plantar fascial brace dispensed  to support the medial longitudinal arch of the foot and offload pressure from the heel and prevent arch collapse during weightbearing - Pharmacologic management: None  Procedure: Injection Tendon/Ligament Location: Left plantar fascia at the glabrous junction; medial approach. Skin Prep: alcohol Injectate: 0.5 cc 0.5% marcaine plain, 0.5 cc of 1% Lidocaine, 0.5 cc kenalog 10. Disposition: Patient tolerated procedure well. Injection site dressed with a band-aid.  No follow-ups on file.

## 2022-10-20 DIAGNOSIS — G4733 Obstructive sleep apnea (adult) (pediatric): Secondary | ICD-10-CM | POA: Diagnosis not present

## 2022-11-07 ENCOUNTER — Ambulatory Visit (INDEPENDENT_AMBULATORY_CARE_PROVIDER_SITE_OTHER): Payer: BC Managed Care – PPO | Admitting: Podiatry

## 2022-11-07 DIAGNOSIS — M722 Plantar fascial fibromatosis: Secondary | ICD-10-CM

## 2022-11-15 DIAGNOSIS — G4733 Obstructive sleep apnea (adult) (pediatric): Secondary | ICD-10-CM | POA: Diagnosis not present

## 2022-11-19 DIAGNOSIS — G4733 Obstructive sleep apnea (adult) (pediatric): Secondary | ICD-10-CM | POA: Diagnosis not present

## 2022-11-21 NOTE — Progress Notes (Signed)
  Subjective:  Patient ID: David Hansen, male    DOB: 02-20-1971,  MRN: [id number]  Chief Complaint  Patient presents with   Plantar Fasciitis    51 y.o. male presents with the above complaint.  Patient presents for follow-up of left heel Planter fasciitis.  He states he is doing little bit better.  He would like to hold off on injection.  He would like to discuss stretching.  Denies any other acute complaints.  Review of Systems: Negative except as noted in the HPI. Denies N/V/F/Ch.  Past Medical History:  Diagnosis Date   Acute back pain 07/04/2012   Acute neck pain 07/04/2012   Chicken pox    Colon polyp 07/27/2011   Depression    Obesity    Sleep apnea 2008   CPAP    Current Outpatient Medications:    blood glucose meter kit and supplies KIT, Dispense based on patient and insurance preference. Use up to four times daily as directed. (FOR ICD-9 250.00, 250.01)., Disp: 1 each, Rfl: 0   cholecalciferol (VITAMIN D3) 25 MCG (1000 UNIT) tablet, Take 1,000 Units by mouth daily., Disp: , Rfl:    indomethacin (INDOCIN) 50 MG capsule, Take 1 capsule (50 mg total) by mouth 3 (three) times daily with meals., Disp: 30 capsule, Rfl: 0   Lancets (ONETOUCH DELICA PLUS [id number]) MISC, USE UP TO 4 TIMES A DAY, Disp: 100 each, Rfl: 0   magnesium 30 MG tablet, Take 30 mg by mouth 2 (two) times daily., Disp: , Rfl:    ONETOUCH VERIO test strip, USE TO CHECK BLOOD GLUCOSE UP TO 4 TIMES A DAY, Disp: 100 strip, Rfl: 0  Social History   Tobacco Use  Smoking Status Never  Smokeless Tobacco Never    No Known Allergies Objective:  There were no vitals filed for this visit. There is no height or weight on file to calculate BMI. Constitutional Well developed. Well nourished.  Vascular Dorsalis pedis pulses palpable bilaterally. Posterior tibial pulses palpable bilaterally. Capillary refill normal to all digits.  No cyanosis or clubbing noted. Pedal hair growth normal.  Neurologic Normal  speech. Oriented to person, place, and time. Epicritic sensation to light touch grossly present bilaterally.  Dermatologic Nails well groomed and normal in appearance. No open wounds. No skin lesions.  Orthopedic: Normal joint ROM without pain or crepitus bilaterally. No visible deformities. Mild tender to palpation at the calcaneal tuber left. No pain with calcaneal squeeze left. Ankle ROM diminished range of motion left. Silfverskiold Test: positive left.   Radiographs: Taken and reviewed. No acute fractures or dislocations. No evidence of stress fracture.  Plantar heel spur absent. Posterior heel spur absent.  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  No other abnormalities noted.  Pes planovalgus foot structure noted  Assessment:   No diagnosis found.  Plan:  Patient was evaluated and treated and all questions answered.  Plantar Fasciitis, left -Patient would like to hold off on further injection.  He would like to focus on conservative care with stretching shoe gear modification.  He would also like to hold off on orthotics and will look into his insurance.  No follow-ups on file.

## 2022-11-22 DIAGNOSIS — R059 Cough, unspecified: Secondary | ICD-10-CM | POA: Diagnosis not present

## 2022-11-22 DIAGNOSIS — J209 Acute bronchitis, unspecified: Secondary | ICD-10-CM | POA: Diagnosis not present

## 2022-11-23 ENCOUNTER — Ambulatory Visit: Payer: BC Managed Care – PPO | Admitting: Primary Care

## 2022-11-30 DIAGNOSIS — R52 Pain, unspecified: Secondary | ICD-10-CM | POA: Diagnosis not present

## 2022-12-05 ENCOUNTER — Ambulatory Visit (INDEPENDENT_AMBULATORY_CARE_PROVIDER_SITE_OTHER): Payer: BC Managed Care – PPO | Admitting: Podiatry

## 2022-12-05 DIAGNOSIS — M722 Plantar fascial fibromatosis: Secondary | ICD-10-CM

## 2022-12-05 NOTE — Progress Notes (Signed)
  Subjective:  Patient ID: David Hansen, male    DOB: 09-07-1971,  MRN: [id number]  Chief Complaint  Patient presents with   Plantar Fasciitis    52 y.o. male presents with the above complaint.  Patient presents for follow-up of left heel Planter fasciitis.  He states he is doing little bit better.  He would like to hold off on injection.  He has been doing stretching which is helping.  He would like to continue conservative care for now.  Review of Systems: Negative except as noted in the HPI. Denies N/V/F/Ch.  Past Medical History:  Diagnosis Date   Acute back pain 07/04/2012   Acute neck pain 07/04/2012   Chicken pox    Colon polyp 07/27/2011   Depression    Obesity    Sleep apnea 2008   CPAP    Current Outpatient Medications:    blood glucose meter kit and supplies KIT, Dispense based on patient and insurance preference. Use up to four times daily as directed. (FOR ICD-9 250.00, 250.01)., Disp: 1 each, Rfl: 0   cholecalciferol (VITAMIN D3) 25 MCG (1000 UNIT) tablet, Take 1,000 Units by mouth daily., Disp: , Rfl:    indomethacin (INDOCIN) 50 MG capsule, Take 1 capsule (50 mg total) by mouth 3 (three) times daily with meals., Disp: 30 capsule, Rfl: 0   Lancets (ONETOUCH DELICA PLUS [id number]) MISC, USE UP TO 4 TIMES A DAY, Disp: 100 each, Rfl: 0   magnesium 30 MG tablet, Take 30 mg by mouth 2 (two) times daily., Disp: , Rfl:    ONETOUCH VERIO test strip, USE TO CHECK BLOOD GLUCOSE UP TO 4 TIMES A DAY, Disp: 100 strip, Rfl: 0  Social History   Tobacco Use  Smoking Status Never  Smokeless Tobacco Never    No Known Allergies Objective:  There were no vitals filed for this visit. There is no height or weight on file to calculate BMI. Constitutional Well developed. Well nourished.  Vascular Dorsalis pedis pulses palpable bilaterally. Posterior tibial pulses palpable bilaterally. Capillary refill normal to all digits.  No cyanosis or clubbing noted. Pedal hair growth  normal.  Neurologic Normal speech. Oriented to person, place, and time. Epicritic sensation to light touch grossly present bilaterally.  Dermatologic Nails well groomed and normal in appearance. No open wounds. No skin lesions.  Orthopedic: Normal joint ROM without pain or crepitus bilaterally. No visible deformities. Mild tender to palpation at the calcaneal tuber left. No pain with calcaneal squeeze left. Ankle ROM diminished range of motion left. Silfverskiold Test: positive left.   Radiographs: Taken and reviewed. No acute fractures or dislocations. No evidence of stress fracture.  Plantar heel spur absent. Posterior heel spur absent.  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  No other abnormalities noted.  Pes planovalgus foot structure noted  Assessment:   No diagnosis found.  Plan:  Patient was evaluated and treated and all questions answered.  Plantar Fasciitis, left -Patient would like to hold off on further injection.  He would like to focus on conservative care with stretching shoe gear modification.  He would also like to hold off on orthotics and will look into his insurance.  No follow-ups on file.

## 2022-12-16 DIAGNOSIS — G4733 Obstructive sleep apnea (adult) (pediatric): Secondary | ICD-10-CM | POA: Diagnosis not present

## 2022-12-20 DIAGNOSIS — G4733 Obstructive sleep apnea (adult) (pediatric): Secondary | ICD-10-CM | POA: Diagnosis not present

## 2023-01-20 DIAGNOSIS — G4733 Obstructive sleep apnea (adult) (pediatric): Secondary | ICD-10-CM | POA: Diagnosis not present

## 2023-05-07 DIAGNOSIS — M13861 Other specified arthritis, right knee: Secondary | ICD-10-CM | POA: Diagnosis not present

## 2023-05-07 DIAGNOSIS — M25561 Pain in right knee: Secondary | ICD-10-CM | POA: Diagnosis not present

## 2023-05-08 DIAGNOSIS — E785 Hyperlipidemia, unspecified: Secondary | ICD-10-CM

## 2023-05-08 DIAGNOSIS — E559 Vitamin D deficiency, unspecified: Secondary | ICD-10-CM

## 2023-05-08 DIAGNOSIS — E538 Deficiency of other specified B group vitamins: Secondary | ICD-10-CM

## 2023-05-08 DIAGNOSIS — M1A9XX0 Chronic gout, unspecified, without tophus (tophi): Secondary | ICD-10-CM

## 2023-05-08 DIAGNOSIS — R7303 Prediabetes: Secondary | ICD-10-CM

## 2023-05-08 DIAGNOSIS — Z125 Encounter for screening for malignant neoplasm of prostate: Secondary | ICD-10-CM

## 2023-05-15 ENCOUNTER — Other Ambulatory Visit: Payer: BC Managed Care – PPO

## 2023-05-15 NOTE — Telephone Encounter (Signed)
David Hansen, will you please have patient scheduled for his labs 2-3 days prior to his CPE?  His lab appointment needs to be before 10 AM.

## 2023-05-15 NOTE — Telephone Encounter (Signed)
Patient's lab visit rescheduled for 6/12 at 8 am

## 2023-05-16 ENCOUNTER — Other Ambulatory Visit (INDEPENDENT_AMBULATORY_CARE_PROVIDER_SITE_OTHER): Payer: BC Managed Care – PPO

## 2023-05-16 DIAGNOSIS — E538 Deficiency of other specified B group vitamins: Secondary | ICD-10-CM

## 2023-05-16 DIAGNOSIS — R7303 Prediabetes: Secondary | ICD-10-CM | POA: Diagnosis not present

## 2023-05-16 DIAGNOSIS — E785 Hyperlipidemia, unspecified: Secondary | ICD-10-CM

## 2023-05-16 DIAGNOSIS — Z125 Encounter for screening for malignant neoplasm of prostate: Secondary | ICD-10-CM

## 2023-05-16 DIAGNOSIS — M1A9XX0 Chronic gout, unspecified, without tophus (tophi): Secondary | ICD-10-CM

## 2023-05-16 DIAGNOSIS — E559 Vitamin D deficiency, unspecified: Secondary | ICD-10-CM

## 2023-05-16 LAB — VITAMIN D 25 HYDROXY (VIT D DEFICIENCY, FRACTURES): VITD: 35.91 ng/mL (ref 30.00–100.00)

## 2023-05-16 LAB — COMPREHENSIVE METABOLIC PANEL
ALT: 22 U/L (ref 0–53)
AST: 15 U/L (ref 0–37)
Albumin: 4.1 g/dL (ref 3.5–5.2)
Alkaline Phosphatase: 38 U/L — ABNORMAL LOW (ref 39–117)
BUN: 21 mg/dL (ref 6–23)
CO2: 30 mEq/L (ref 19–32)
Calcium: 9.1 mg/dL (ref 8.4–10.5)
Chloride: 101 mEq/L (ref 96–112)
Creatinine, Ser: 1.07 mg/dL (ref 0.40–1.50)
GFR: 80.11 mL/min (ref >60.00)
Glucose, Bld: 111 mg/dL — ABNORMAL HIGH (ref 70–99)
Potassium: 4.8 mEq/L (ref 3.5–5.1)
Sodium: 136 mEq/L (ref 135–145)
Total Bilirubin: 0.5 mg/dL (ref 0.2–1.2)
Total Protein: 6.6 g/dL (ref 6.0–8.3)

## 2023-05-16 LAB — LIPID PANEL
Cholesterol: 160 mg/dL (ref 0–200)
HDL: 42.2 mg/dL (ref >39.00)
LDL Cholesterol: 101 mg/dL — ABNORMAL HIGH (ref 0–99)
NonHDL: 117.67
Total CHOL/HDL Ratio: 4
Triglycerides: 84 mg/dL (ref 0.0–149.0)
VLDL: 16.8 mg/dL (ref 0.0–40.0)

## 2023-05-16 LAB — PSA: PSA: 1.4 ng/mL (ref 0.10–4.00)

## 2023-05-16 LAB — HEMOGLOBIN A1C: Hgb A1c MFr Bld: 6.1 % (ref 4.6–6.5)

## 2023-05-16 LAB — VITAMIN B12: Vitamin B-12: 314 pg/mL (ref 211–911)

## 2023-05-16 LAB — URIC ACID: Uric Acid, Serum: 7.5 mg/dL (ref 4.0–7.8)

## 2023-05-17 ENCOUNTER — Other Ambulatory Visit (INDEPENDENT_AMBULATORY_CARE_PROVIDER_SITE_OTHER): Payer: BC Managed Care – PPO

## 2023-05-17 DIAGNOSIS — R7989 Other specified abnormal findings of blood chemistry: Secondary | ICD-10-CM | POA: Diagnosis not present

## 2023-05-17 DIAGNOSIS — E291 Testicular hypofunction: Secondary | ICD-10-CM

## 2023-05-17 DIAGNOSIS — D4620 Refractory anemia with excess of blasts, unspecified: Secondary | ICD-10-CM

## 2023-05-17 LAB — TESTOSTERONE: Testosterone: 152.9 ng/dL — ABNORMAL LOW (ref 300.00–890.00)

## 2023-05-17 LAB — INSULIN, RANDOM: Insulin: 18.6 u[IU]/mL — ABNORMAL HIGH

## 2023-05-22 ENCOUNTER — Encounter: Payer: Self-pay | Admitting: Primary Care

## 2023-05-22 ENCOUNTER — Ambulatory Visit (INDEPENDENT_AMBULATORY_CARE_PROVIDER_SITE_OTHER): Payer: BC Managed Care – PPO | Admitting: Primary Care

## 2023-05-22 VITALS — BP 130/78 | HR 72 | Temp 98.6°F | Ht 72.0 in | Wt 376.0 lb

## 2023-05-22 DIAGNOSIS — G4733 Obstructive sleep apnea (adult) (pediatric): Secondary | ICD-10-CM | POA: Diagnosis not present

## 2023-05-22 DIAGNOSIS — R7989 Other specified abnormal findings of blood chemistry: Secondary | ICD-10-CM

## 2023-05-22 DIAGNOSIS — Z0000 Encounter for general adult medical examination without abnormal findings: Secondary | ICD-10-CM

## 2023-05-22 DIAGNOSIS — Z6841 Body Mass Index (BMI) 40.0 and over, adult: Secondary | ICD-10-CM

## 2023-05-22 DIAGNOSIS — M1A4710 Other secondary chronic gout, right ankle and foot, without tophus (tophi): Secondary | ICD-10-CM

## 2023-05-22 DIAGNOSIS — Z800 Family history of malignant neoplasm of digestive organs: Secondary | ICD-10-CM

## 2023-05-22 DIAGNOSIS — R7303 Prediabetes: Secondary | ICD-10-CM

## 2023-05-22 DIAGNOSIS — R5382 Chronic fatigue, unspecified: Secondary | ICD-10-CM

## 2023-05-22 DIAGNOSIS — L301 Dyshidrosis [pompholyx]: Secondary | ICD-10-CM

## 2023-05-22 NOTE — Assessment & Plan Note (Signed)
Long discussion today regarding his current diet and lifestyle. Recommended he cut back on portion sizes and snacking. Resume walking daily.  Referral placed for [facility] wellness center.

## 2023-05-22 NOTE — Patient Instructions (Signed)
You will either be contacted via phone regarding your referral to Urology and to [facility], or you may receive a letter on your MyChart portal from our referral team with instructions for scheduling an appointment. Please let us know if you have not been contacted by anyone within two weeks.  Cut back on snacking.  Resume regular walking.  Let me know if you experience difficulty scheduling your colonoscopy.  It was a pleasure to see you today!

## 2023-05-22 NOTE — Assessment & Plan Note (Signed)
Following with pulmonology. Continue CPAP machine. 

## 2023-05-22 NOTE — Assessment & Plan Note (Signed)
Discussed his current diet, recommended he cut back on portion sizes and to start a food journal.   A1C on recent labs slightly improved. Resume walking.

## 2023-05-22 NOTE — Assessment & Plan Note (Signed)
Colonoscopy due in August.  Discussed with patient today.   He will contact Dr. Purvis Sheffield office to schedule. He will contact us if he has any issues.

## 2023-05-22 NOTE — Assessment & Plan Note (Signed)
No recent flares.  Continue indomethacin 50 mg PRN. Uric acid level reviewed and is stable.

## 2023-05-22 NOTE — Progress Notes (Signed)
Subjective:    Patient ID: David Hansen, male    DOB: [birth year], 52 y.o.   MRN: [id number]  HPI  David Hansen is a very pleasant 52 y.o. male who presents today for complete physical and follow up of chronic conditions.  He would also like to discuss fatigue and weight gain.  History of chronic fatigue and ongoing tiredness.  He is compliant with CPAP machine nightly.  History of testosterone deficiency, previously following with urology, but the testosterone patches are causing irritation.  He has not followed up since.  Recent testosterone level of 152.  He is also frustrated with his weight gain despite his attempts at healthy diet and exercise.  He walks at least 30 minutes daily during the week and an hour and a half daily during the weekends.  He endorses a mostly healthy diet, tries to cut back on carbs and limit sweets.  He has a family history of obesity in his mother and sister who have undergone weight loss surgery.  He is not interested in this.  Immunizations: -Tetanus: Completed in 2016 -Shingles: Completed Shingrix series  Diet currently consists of:  Breakfast: Black coffee no sugar Lunch: Salad, vegetable, protein - Art therapist: protein, vegetable, potatoes, pasta Snacks: nuts, cottage cheese with fruit Desserts: Candy 2 days weekly Beverages: Water, unsweet tea, coffee, seltzer water  Exercise: Walking 30 minutes daily during the week, 1.5 hours during weekends  Eye exam: Completes annually  Dental exam: Completes semi-annually    Colonoscopy: Completed in 2019, due August 2024, he is aware.   PSA: 1.4 in 2024  Wt Readings from Last 3 Encounters:  05/22/23 (!) 376 lb (170.6 kg)  06/27/22 (!) 365 lb (165.6 kg)  05/09/22 (!) 360 lb (163.3 kg)   Body mass index is 50.99 kg/m.    Review of Systems  Constitutional:  Positive for fatigue. Negative for unexpected weight change.  HENT:  Negative for rhinorrhea.   Respiratory:  Negative for  cough and shortness of breath.   Cardiovascular:  Negative for chest pain.  Gastrointestinal:  Negative for constipation and diarrhea.  Genitourinary:  Negative for difficulty urinating.  Musculoskeletal:  Positive for arthralgias.  Skin:  Negative for rash.  Allergic/Immunologic: Negative for environmental allergies.  Neurological:  Negative for dizziness and headaches.  Psychiatric/Behavioral:  The patient is not nervous/anxious.          Past Medical History:  Diagnosis Date   Acute back pain 07/04/2012   Acute neck pain 07/04/2012   Chicken pox    Colon polyp 07/27/2011   Depression    Obesity    Sleep apnea 2008   CPAP    Social History   Socioeconomic History   Marital status: Married    Spouse name: Not on file   Number of children: 2   Years of education: Not on file   Highest education level: Not on file  Occupational History   Occupation: Comptroller  Tobacco Use   Smoking status: Never   Smokeless tobacco: Never  Substance and Sexual Activity   Alcohol use: Yes    Alcohol/week: 0.0 standard drinks of alcohol    Comment: rarely   Drug use: No   Sexual activity: Not on file  Other Topics Concern   Not on file  Social History Narrative   Regular exercise yes 2-3 times a week...walking      Diet: fruits and veggies,lean protein, water   Social Determinants of Health   Financial  Resource Strain: Not on file  Food Insecurity: Not on file  Transportation Needs: Not on file  Physical Activity: Not on file  Stress: Not on file  Social Connections: Not on file  Intimate Partner Violence: Not on file    Past Surgical History:  Procedure Laterality Date   COLONOSCOPY  07/27/2011   Dr Russella Dar   COLONOSCOPY WITH PROPOFOL N/A 07/24/2018   Procedure: COLONOSCOPY WITH PROPOFOL;  Surgeon: Earline Mayotte, MD;  Location: [facility] ENDOSCOPY;  Service: Endoscopy;  Laterality: N/A;   KNEE ARTHROSCOPY W/ ACL RECONSTRUCTION AND PATELLA GRAFT Right 1998    [facility] in [place]  Right knee    Family History  Problem Relation Age of Onset   Cancer Father 56       colon cancer    No Known Allergies  Current Outpatient Medications on File Prior to Visit  Medication Sig Dispense Refill   blood glucose meter kit and supplies KIT Dispense based on patient and insurance preference. Use up to four times daily as directed. (FOR ICD-9 250.00, 250.01). 1 each 0   cholecalciferol (VITAMIN D3) 25 MCG (1000 UNIT) tablet Take 1,000 Units by mouth daily.     Lancets (ONETOUCH DELICA PLUS LANCET33G) MISC USE UP TO 4 TIMES A DAY 100 each 0   magnesium 30 MG tablet Take 30 mg by mouth 2 (two) times daily.     ONETOUCH VERIO test strip USE TO CHECK BLOOD GLUCOSE UP TO 4 TIMES A DAY 100 strip 0   indomethacin (INDOCIN) 50 MG capsule Take 1 capsule (50 mg total) by mouth 3 (three) times daily with meals. (Patient not taking: Reported on 05/22/2023) 30 capsule 0   No current facility-administered medications on file prior to visit.    BP 130/78   Pulse 72   Temp 98.6 F (37 C) (Temporal)   Ht 6' (1.829 m)   Wt (!) 376 lb (170.6 kg)   SpO2 98%   BMI 50.99 kg/m  Objective:   Physical Exam HENT:     Right Ear: Tympanic membrane and ear canal normal.     Left Ear: Tympanic membrane and ear canal normal.     Nose: Nose normal.     Right Sinus: No maxillary sinus tenderness or frontal sinus tenderness.     Left Sinus: No maxillary sinus tenderness or frontal sinus tenderness.  Eyes:     Conjunctiva/sclera: Conjunctivae normal.  Neck:     Thyroid: No thyromegaly.     Vascular: No carotid bruit.  Cardiovascular:     Rate and Rhythm: Normal rate and regular rhythm.     Heart sounds: Normal heart sounds.  Pulmonary:     Effort: Pulmonary effort is normal.     Breath sounds: Normal breath sounds. No wheezing or rales.  Abdominal:     General: Bowel sounds are normal.     Palpations: Abdomen is soft.     Tenderness: There is no abdominal  tenderness.  Musculoskeletal:        General: Normal range of motion.     Cervical back: Neck supple.  Skin:    General: Skin is warm and dry.  Neurological:     Mental Status: He is alert and oriented to person, place, and time.     Cranial Nerves: No cranial nerve deficit.     Deep Tendon Reflexes: Reflexes are normal and symmetric.  Psychiatric:        Mood and Affect: Mood normal.  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  Continue indomethacin 50 mg PRN. Uric acid level reviewed and is stable.     Obstructive sleep apnea Assessment & Plan: Following with pulmonology. Continue CPAP machine.   Dyshidrotic eczema Assessment & Plan: Controlled.  Continue OTC lotion.   Family history of colon cancer in father Assessment & Plan: Colonoscopy due in August.  Discussed with patient today.   He will contact Dr. Purvis Sheffield office to schedule. He will contact us if he has any issues.    Chronic fatigue Assessment & Plan: Chronic and ongoing   Reviewed recent labs, testosterone levels which are very low. Continue CPAP machine.   Referral placed to Urology.   Prediabetes Assessment & Plan: Discussed his current diet, recommended he cut back on portion sizes and to start a food journal.   A1C on recent labs slightly improved. Resume walking.   Class 3 severe obesity due to excess calories without serious comorbidity with body mass index (BMI) of 50.0 to 59.9 in adult [facility]) Assessment & Plan: Long discussion today regarding his current diet and lifestyle. Recommended he cut back on portion sizes and snacking. Resume walking daily.  Referral placed for healthy weight and wellness center.  Orders: -     Amb Ref to Medical Weight Management  Low testosterone in male -     Ambulatory referral to Urology  Preventative health care Assessment  & Plan: Immunizations UTD. Colonoscopy UTD, due August 2024. He is aware and will contact to schedule.  PSA 1.40   Discussed the importance of a healthy diet and regular exercise in order for weight loss, and to reduce the risk of further co-morbidity.  Exam stable. Labs reviewed.   Follow up in 1 year for repeat physical.          Doreene Nest, NP

## 2023-05-22 NOTE — Assessment & Plan Note (Signed)
Controlled.  Continue OTC lotion.

## 2023-05-22 NOTE — Assessment & Plan Note (Signed)
Chronic and ongoing   Reviewed recent labs, testosterone levels which are very low. Continue CPAP machine.   Referral placed to Urology.

## 2023-05-22 NOTE — Assessment & Plan Note (Signed)
Immunizations UTD. Colonoscopy UTD, due August 2024. He is aware and will contact to schedule.  PSA 1.40   Discussed the importance of a healthy diet and regular exercise in order for weight loss, and to reduce the risk of further co-morbidity.  Exam stable. Labs reviewed.   Follow up in 1 year for repeat physical.

## 2023-06-01 NOTE — Telephone Encounter (Signed)
Please advise on [facility]

## 2023-06-11 ENCOUNTER — Telehealth: Payer: Self-pay | Admitting: Primary Care

## 2023-06-11 NOTE — Telephone Encounter (Signed)
Patient brought in pre participation physical form to be filled out by provider. Asked if he could be called to pick this form up once complete. Patient can be reached at [phone]. Thank you

## 2023-06-11 NOTE — Telephone Encounter (Signed)
Form has been placed in [place] inbox for review and completion

## 2023-06-12 NOTE — Telephone Encounter (Signed)
Called and advised patient, ppw is ready to be picked up.

## 2023-06-12 NOTE — Telephone Encounter (Signed)
Completed and placed in Kelli's inbox. 

## 2023-10-05 DIAGNOSIS — Z419 Encounter for procedure for purposes other than remedying health state, unspecified: Secondary | ICD-10-CM | POA: Diagnosis not present

## 2023-11-04 DIAGNOSIS — Z419 Encounter for procedure for purposes other than remedying health state, unspecified: Secondary | ICD-10-CM | POA: Diagnosis not present

## 2023-12-05 DIAGNOSIS — Z419 Encounter for procedure for purposes other than remedying health state, unspecified: Secondary | ICD-10-CM | POA: Diagnosis not present

## 2024-01-05 DIAGNOSIS — Z419 Encounter for procedure for purposes other than remedying health state, unspecified: Secondary | ICD-10-CM | POA: Diagnosis not present

## 2024-02-02 DIAGNOSIS — Z419 Encounter for procedure for purposes other than remedying health state, unspecified: Secondary | ICD-10-CM | POA: Diagnosis not present

## 2024-03-15 DIAGNOSIS — Z419 Encounter for procedure for purposes other than remedying health state, unspecified: Secondary | ICD-10-CM | POA: Diagnosis not present

## 2024-04-14 DIAGNOSIS — Z419 Encounter for procedure for purposes other than remedying health state, unspecified: Secondary | ICD-10-CM | POA: Diagnosis not present

## 2024-04-24 DIAGNOSIS — E559 Vitamin D deficiency, unspecified: Secondary | ICD-10-CM

## 2024-04-24 DIAGNOSIS — E538 Deficiency of other specified B group vitamins: Secondary | ICD-10-CM

## 2024-04-24 DIAGNOSIS — E785 Hyperlipidemia, unspecified: Secondary | ICD-10-CM

## 2024-04-24 DIAGNOSIS — M1A9XX0 Chronic gout, unspecified, without tophus (tophi): Secondary | ICD-10-CM

## 2024-04-24 DIAGNOSIS — R7989 Other specified abnormal findings of blood chemistry: Secondary | ICD-10-CM

## 2024-04-24 DIAGNOSIS — R7303 Prediabetes: Secondary | ICD-10-CM

## 2024-04-24 DIAGNOSIS — Z125 Encounter for screening for malignant neoplasm of prostate: Secondary | ICD-10-CM

## 2024-04-27 NOTE — Telephone Encounter (Signed)
 Kelli, will you please fit him in for a CPE sometime this week? Also needs lab appt a few days prior.

## 2024-04-29 NOTE — Telephone Encounter (Signed)
 Patient has been scheduled

## 2024-04-30 ENCOUNTER — Other Ambulatory Visit (INDEPENDENT_AMBULATORY_CARE_PROVIDER_SITE_OTHER): Payer: Self-pay

## 2024-04-30 DIAGNOSIS — R7989 Other specified abnormal findings of blood chemistry: Secondary | ICD-10-CM

## 2024-04-30 DIAGNOSIS — M1A9XX0 Chronic gout, unspecified, without tophus (tophi): Secondary | ICD-10-CM

## 2024-04-30 DIAGNOSIS — Z125 Encounter for screening for malignant neoplasm of prostate: Secondary | ICD-10-CM

## 2024-04-30 DIAGNOSIS — R7303 Prediabetes: Secondary | ICD-10-CM

## 2024-04-30 DIAGNOSIS — E785 Hyperlipidemia, unspecified: Secondary | ICD-10-CM

## 2024-04-30 DIAGNOSIS — E538 Deficiency of other specified B group vitamins: Secondary | ICD-10-CM

## 2024-04-30 DIAGNOSIS — E559 Vitamin D deficiency, unspecified: Secondary | ICD-10-CM

## 2024-04-30 LAB — CBC
HCT: 40.9 % (ref 39.0–52.0)
Hemoglobin: 13.7 g/dL (ref 13.0–17.0)
MCHC: 33.5 g/dL (ref 30.0–36.0)
MCV: 84.9 fl (ref 78.0–100.0)
Platelets: 269 10*3/uL (ref 150.0–400.0)
RBC: 4.82 Mil/uL (ref 4.22–5.81)
RDW: 14.5 % (ref 11.5–15.5)
WBC: 3.9 10*3/uL — ABNORMAL LOW (ref 4.0–10.5)

## 2024-04-30 LAB — VITAMIN D 25 HYDROXY (VIT D DEFICIENCY, FRACTURES): VITD: 46.04 ng/mL (ref 30.00–100.00)

## 2024-04-30 LAB — COMPREHENSIVE METABOLIC PANEL WITH GFR
ALT: 14 U/L (ref 0–53)
AST: 13 U/L (ref 0–37)
Albumin: 4 g/dL (ref 3.5–5.2)
Alkaline Phosphatase: 45 U/L (ref 39–117)
BUN: 13 mg/dL (ref 6–23)
CO2: 27 meq/L (ref 19–32)
Calcium: 9.1 mg/dL (ref 8.4–10.5)
Chloride: 104 meq/L (ref 96–112)
Creatinine, Ser: 1.01 mg/dL (ref 0.40–1.50)
GFR: 85.28 mL/min (ref >60.00)
Glucose, Bld: 124 mg/dL — ABNORMAL HIGH (ref 70–99)
Potassium: 4.4 meq/L (ref 3.5–5.1)
Sodium: 138 meq/L (ref 135–145)
Total Bilirubin: 0.4 mg/dL (ref 0.2–1.2)
Total Protein: 6.3 g/dL (ref 6.0–8.3)

## 2024-04-30 LAB — LIPID PANEL
Cholesterol: 163 mg/dL (ref 0–200)
HDL: 42.2 mg/dL (ref >39.00)
LDL Cholesterol: 103 mg/dL — ABNORMAL HIGH (ref 0–99)
NonHDL: 120.71
Total CHOL/HDL Ratio: 4
Triglycerides: 88 mg/dL (ref 0.0–149.0)
VLDL: 17.6 mg/dL (ref 0.0–40.0)

## 2024-04-30 LAB — PSA: PSA: 1.14 ng/mL (ref 0.10–4.00)

## 2024-04-30 LAB — HEMOGLOBIN A1C: Hgb A1c MFr Bld: 6.3 % (ref 4.6–6.5)

## 2024-04-30 LAB — URIC ACID: Uric Acid, Serum: 7.6 mg/dL (ref 4.0–7.8)

## 2024-04-30 LAB — TESTOSTERONE: Testosterone: 191.52 ng/dL — ABNORMAL LOW (ref 300.00–890.00)

## 2024-04-30 LAB — TSH: TSH: 3.41 u[IU]/mL (ref 0.35–5.50)

## 2024-04-30 LAB — VITAMIN B12: Vitamin B-12: 809 pg/mL (ref 211–911)

## 2024-05-01 ENCOUNTER — Ambulatory Visit: Payer: Self-pay | Admitting: Primary Care

## 2024-05-01 LAB — INSULIN, RANDOM: Insulin: 23.6 u[IU]/mL — ABNORMAL HIGH

## 2024-05-02 ENCOUNTER — Encounter: Payer: Self-pay | Admitting: Primary Care

## 2024-05-02 ENCOUNTER — Ambulatory Visit (INDEPENDENT_AMBULATORY_CARE_PROVIDER_SITE_OTHER): Admitting: Primary Care

## 2024-05-02 VITALS — BP 128/82 | HR 71 | Temp 98.0°F | Ht 72.0 in | Wt 375.0 lb

## 2024-05-02 DIAGNOSIS — M1A4710 Other secondary chronic gout, right ankle and foot, without tophus (tophi): Secondary | ICD-10-CM

## 2024-05-02 DIAGNOSIS — R7303 Prediabetes: Secondary | ICD-10-CM

## 2024-05-02 DIAGNOSIS — E66813 Obesity, class 3: Secondary | ICD-10-CM

## 2024-05-02 DIAGNOSIS — Z800 Family history of malignant neoplasm of digestive organs: Secondary | ICD-10-CM

## 2024-05-02 DIAGNOSIS — G4733 Obstructive sleep apnea (adult) (pediatric): Secondary | ICD-10-CM | POA: Diagnosis not present

## 2024-05-02 DIAGNOSIS — Z0000 Encounter for general adult medical examination without abnormal findings: Secondary | ICD-10-CM

## 2024-05-02 DIAGNOSIS — Z1211 Encounter for screening for malignant neoplasm of colon: Secondary | ICD-10-CM

## 2024-05-02 DIAGNOSIS — Z6841 Body Mass Index (BMI) 40.0 and over, adult: Secondary | ICD-10-CM

## 2024-05-02 MED ORDER — INDOMETHACIN 50 MG PO CAPS
50.0000 mg | ORAL_CAPSULE | Freq: Three times a day (TID) | ORAL | 0 refills | Status: AC
Start: 2024-05-02 — End: ?

## 2024-05-02 NOTE — Assessment & Plan Note (Addendum)
 Immunizations UTD. Colonoscopy overdue, referral placed to GI PSA UTD  Discussed the importance of a healthy diet and regular exercise in order for weight loss, and to reduce the risk of further co-morbidity.  Exam stable. Labs reviewed  Follow up in 1 year for repeat physical.

## 2024-05-02 NOTE — Patient Instructions (Signed)
 You will either be contacted via phone regarding your referral to healthy weight and wellness center in [place] and GI for the colonoscopy, or you may receive a letter on your MyChart portal from our referral team with instructions for scheduling an appointment. Please let us  know if you have not been contacted by anyone within two weeks.  It was a pleasure to see you today!

## 2024-05-02 NOTE — Assessment & Plan Note (Signed)
 Commended him on his improved diet and physical activity.   Reviewed labs today which show insulin  resistance/prediabetes. Referral placed for healthy weight and wellness in [place].

## 2024-05-02 NOTE — Assessment & Plan Note (Signed)
 Overdue for colonoscopy, referral placed to GI.

## 2024-05-02 NOTE — Assessment & Plan Note (Addendum)
 Overall stable, infrequent flares.  Continue indomethacin  50 mg PRN.  Reviewed recent uric acid level.

## 2024-05-02 NOTE — Assessment & Plan Note (Signed)
 Slightly deteriorated with recent A1c of 6.3.  Continue work on improved diet and physical activity. Referral placed to healthy weight wellness center in [place]

## 2024-05-02 NOTE — Assessment & Plan Note (Signed)
Continue CPAP machine.

## 2024-05-02 NOTE — Progress Notes (Signed)
 Subjective:    Patient ID: David Hansen, male    DOB: [birth year], 53 y.o.   MRN: [id number]  HPI  David Hansen is a very pleasant 53 y.o. male who presents today for complete physical and follow up of chronic conditions.  He would also like to discuss brain fog and low energy.   Chronic fatigue. Managed on CPAP for sleep apnea, is compliant every night. He believes he sleeps well, about 7-8 hours per night. History of low Testerone, following with Urology, little improvement with patches historically which also caused skin irritation. He has an upcoming appointment with Urology in July. He is walking daily 6000-7000 steps per day, walking on the weekends. Also with difficulty focusing, feels brain fog.   He's frustrated with his weight and lack of weight loss despite efforts towards weight loss. He checks his blood sugar on occasion, most numbers are 95-102 in the AM fasting. He has cut back down on his portion sizes.   Immunizations: -Tetanus: Completed in 2016 -Shingles: Completed Shingrix series  Diet currently consists of:  Breakfast: Black coffee sometimes with creamer, sometimes eggs and sausage Lunch: Salad, left overs Dinner: Pasta, chicken, veggies, rice dish Snacks: Fruit, nuts, crackers, yogurt  Desserts: 1-2 times weekly  Beverages: Water   Exercise: Regular exercise.  Eye exam: Completes annually  Dental exam: Completes semi-annually    Colonoscopy: Completed in 2019, due August 2024   PSA: Due  Wt Readings from Last 3 Encounters:  05/02/24 (!) 375 lb (170.1 kg)  05/22/23 (!) 376 lb (170.6 kg)  06/27/22 (!) 365 lb (165.6 kg)      BP Readings from Last 3 Encounters:  05/02/24 128/82  05/22/23 130/78  06/27/22 (!) 112/50      Review of Systems  Constitutional:  Positive for fatigue. Negative for unexpected weight change.  HENT:  Negative for rhinorrhea.   Respiratory:  Negative for cough and shortness of breath.   Cardiovascular:   Negative for chest pain.  Gastrointestinal:  Negative for constipation and diarrhea.  Genitourinary:  Negative for difficulty urinating.  Musculoskeletal:  Negative for arthralgias and myalgias.  Skin:  Negative for rash.  Allergic/Immunologic: Negative for environmental allergies.  Neurological:  Negative for dizziness and headaches.  Psychiatric/Behavioral:  The patient is not nervous/anxious.          Past Medical History:  Diagnosis Date   Acute back pain 07/04/2012   Acute neck pain 07/04/2012   Chicken pox    Colon polyp 07/27/2011   Depression    Obesity    Sleep apnea 2008   CPAP    Social History   Socioeconomic History   Marital status: Married    Spouse name: Not on file   Number of children: 2   Years of education: Not on file   Highest education level: Not on file  Occupational History   Occupation: Comptroller  Tobacco Use   Smoking status: Never   Smokeless tobacco: Never  Substance and Sexual Activity   Alcohol use: Yes    Alcohol/week: 0.0 standard drinks of alcohol    Comment: rarely   Drug use: No   Sexual activity: Not on file  Other Topics Concern   Not on file  Social History Narrative   Regular exercise yes 2-3 times a week...walking      Diet: fruits and veggies,lean protein, water   Social Drivers of Health   Financial Resource Strain: Not on file  Food Insecurity: Not on file  Transportation Needs: Not on file  Physical Activity: Not on file  Stress: Not on file  Social Connections: Not on file  Intimate Partner Violence: Not on file    Past Surgical History:  Procedure Laterality Date   COLONOSCOPY  07/27/2011   Dr Sandrea Cruel   COLONOSCOPY WITH PROPOFOL  N/A 07/24/2018   Procedure: COLONOSCOPY WITH PROPOFOL ;  Surgeon: Marshall Skeeter, MD;  Location: [facility] ENDOSCOPY;  Service: Endoscopy;  Laterality: N/A;   KNEE ARTHROSCOPY W/ ACL RECONSTRUCTION AND PATELLA GRAFT Right 1998   [facility] in [place]  Right knee     Family History  Problem Relation Age of Onset   Cancer Father 64       colon cancer    No Known Allergies  Current Outpatient Medications on File Prior to Visit  Medication Sig Dispense Refill   blood glucose meter kit and supplies KIT Dispense based on patient and insurance preference. Use up to four times daily as directed. (FOR ICD-9 250.00, 250.01). 1 each 0   cholecalciferol (VITAMIN D3) 25 MCG (1000 UNIT) tablet Take 5,000 Units by mouth daily.     Lancets (ONETOUCH DELICA PLUS LANCET33G) MISC USE UP TO 4 TIMES A DAY 100 each 0   magnesium 30 MG tablet Take 30 mg by mouth 2 (two) times daily.     ONETOUCH VERIO test strip USE TO CHECK BLOOD GLUCOSE UP TO 4 TIMES A DAY 100 strip 0   No current facility-administered medications on file prior to visit.    BP 128/82   Pulse 71   Temp 98 F (36.7 C) (Temporal)   Ht 6' (1.829 m)   Wt (!) 375 lb (170.1 kg)   SpO2 96%   BMI 50.86 kg/m  Objective:   Physical Exam HENT:     Right Ear: Tympanic membrane and ear canal normal.     Left Ear: Tympanic membrane and ear canal normal.  Eyes:     Pupils: Pupils are equal, round, and reactive to light.  Cardiovascular:     Rate and Rhythm: Normal rate and regular rhythm.  Pulmonary:     Effort: Pulmonary effort is normal.     Breath sounds: Normal breath sounds.  Abdominal:     General: Bowel sounds are normal.     Palpations: Abdomen is soft.     Tenderness: There is no abdominal tenderness.  Musculoskeletal:        General: Normal range of motion.     Cervical back: Neck supple.  Skin:    General: Skin is warm and dry.  Neurological:     Mental Status: He is alert and oriented to person, place, and time.     Cranial Nerves: No cranial nerve deficit.     Deep Tendon Reflexes:     Reflex Scores:      Patellar reflexes are 2+ on the right side and 2+ on the left side. Psychiatric:        Mood and Affect: Mood normal.           Assessment & Plan:  Obstructive  sleep apnea Assessment & Plan: Continue CPAP machine.   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  Continue indomethacin  50 mg PRN.  Reviewed recent uric acid level.   Orders: -     Indomethacin ; Take 1 capsule (50 mg total) by mouth 3 (three) times daily with meals.  Dispense: 15 capsule; Refill: 0  Class 3 severe obesity due to excess calories with body mass index (BMI) of 50.0 to 59.9 in adult Assessment & Plan: Commended him on his improved diet and physical activity.   Reviewed labs today which show insulin  resistance/prediabetes. Referral placed for healthy weight and wellness in [place].   Orders: -     Amb Ref to Medical Weight Management  Family history of colon cancer in father Assessment & Plan: Overdue for colonoscopy, referral placed to GI   Screening for colon cancer -     Ambulatory referral to Gastroenterology  Preventative health care Assessment & Plan: Immunizations UTD. Colonoscopy overdue, referral placed to GI PSA UTD  Discussed the importance of a healthy diet and regular exercise in order for weight loss, and to reduce the risk of further co-morbidity.  Exam stable. Labs reviewed  Follow up in 1 year for repeat physical.    Prediabetes Assessment & Plan: Slightly deteriorated with recent A1c of 6.3.  Continue work on improved diet and physical activity. Referral placed to healthy weight wellness center in [place] [facility], [place]

## 2024-05-04 MED ORDER — FREESTYLE LIBRE 3 PLUS SENSOR MISC
1 refills | Status: DC
Start: 2024-05-04 — End: 2024-05-09

## 2024-05-09 MED ORDER — DEXCOM G7 RECEIVER DEVI: 3 refills | Status: AC

## 2024-05-09 MED ORDER — DEXCOM G7 SENSOR MISC: 3 refills | Status: AC

## 2024-05-15 DIAGNOSIS — Z419 Encounter for procedure for purposes other than remedying health state, unspecified: Secondary | ICD-10-CM | POA: Diagnosis not present

## 2024-05-27 ENCOUNTER — Encounter: Payer: Self-pay | Admitting: Primary Care

## 2024-06-02 ENCOUNTER — Encounter (INDEPENDENT_AMBULATORY_CARE_PROVIDER_SITE_OTHER): Payer: Self-pay

## 2024-06-03 ENCOUNTER — Encounter (INDEPENDENT_AMBULATORY_CARE_PROVIDER_SITE_OTHER): Payer: Self-pay | Admitting: Adult Health

## 2024-06-03 ENCOUNTER — Ambulatory Visit (INDEPENDENT_AMBULATORY_CARE_PROVIDER_SITE_OTHER): Payer: Self-pay | Admitting: Adult Health

## 2024-06-03 VITALS — BP 139/83 | HR 77 | Temp 98.2°F | Ht 70.5 in | Wt 368.0 lb

## 2024-06-03 DIAGNOSIS — Z6841 Body Mass Index (BMI) 40.0 and over, adult: Secondary | ICD-10-CM

## 2024-06-03 DIAGNOSIS — R7989 Other specified abnormal findings of blood chemistry: Secondary | ICD-10-CM | POA: Diagnosis not present

## 2024-06-03 DIAGNOSIS — M1A9XX0 Chronic gout, unspecified, without tophus (tophi): Secondary | ICD-10-CM

## 2024-06-03 DIAGNOSIS — E538 Deficiency of other specified B group vitamins: Secondary | ICD-10-CM | POA: Diagnosis not present

## 2024-06-03 DIAGNOSIS — R7303 Prediabetes: Secondary | ICD-10-CM

## 2024-06-03 DIAGNOSIS — G4733 Obstructive sleep apnea (adult) (pediatric): Secondary | ICD-10-CM

## 2024-06-03 DIAGNOSIS — E785 Hyperlipidemia, unspecified: Secondary | ICD-10-CM

## 2024-06-03 DIAGNOSIS — E559 Vitamin D deficiency, unspecified: Secondary | ICD-10-CM

## 2024-06-03 NOTE — Progress Notes (Signed)
 Office: [phone]  /  Fax: [phone]   Initial Visit    David Hansen was seen in clinic today to evaluate for obesity. He is interested in losing weight to improve overall health and reduce the risk of weight related complications. He presents today to review program treatment options, initial physical assessment, and evaluation.     He was referred by: PCP  When asked what else they would like to accomplish? He states: Adopt a healthier eating pattern and lifestyle, Improve energy levels and physical activity, Improve existing medical conditions, Improve quality of life, and Current Weight 368 lbs, Goal is to loss 100 lbs  When asked how has your weight affected you? He states: Contributed to medical problems, Contributed to orthopedic problems or mobility issues, Having fatigue, and Having poor endurance  Weight history: He experienced weight gain after highschool and he stopped playing football.  He also reports significant weight gain the last 10 years.  Highest weight: 375 lbs  Some associated conditions: Hyperlipidemia, Prediabetes, and Vitamin D  Deficiency  Contributing factors: disruption of circadian rhythm / sleep disordered breathing, consumption of processed foods, moderate to high levels of stress, and multiple weight loss attempts in the past  Weight promoting medications identified: None  Prior weight loss attempts: Weight Watchers, Low Carb, Ketogenic, and Intermittent fasting  Current nutrition plan: Other: IF 16.8 4 days per week  Current level of physical activity: Walking 30 minutes, three a week 6K steps/day when working  Current or previous pharmacotherapy: None  Response to medication: Never tried medications   Past medical history includes:   Past Medical History:  Diagnosis Date   Acute back pain 07/04/2012   Acute neck pain 07/04/2012   Chicken pox    Colon polyp 07/27/2011   Depression    Obesity    Sleep apnea 2008   CPAP      Objective    BP 139/83   Pulse 77   Temp 98.2 F (36.8 C)   Ht 5' 10.5 (1.791 m)   Wt (!) 368 lb (166.9 kg)   SpO2 96%   BMI 52.06 kg/m  He was weighed on the bioimpedance scale: Body mass index is 52.06 kg/m.  Body Fat%:43.1, Visceral Fat Rating:32, Weight trend over the last 12 months: Unchanged  General:  Alert, oriented and cooperative. Patient is in no acute distress.  Respiratory: Normal respiratory effort, no problems with respiration noted   Gait: able to ambulate independently  Mental Status: Normal mood and affect. Normal behavior. Normal judgment and thought content.   DIAGNOSTIC DATA REVIEWED:  BMET    Component Value Date/Time   NA 138 04/30/2024 0739   K 4.4 04/30/2024 0739   CL 104 04/30/2024 0739   CO2 27 04/30/2024 0739   GLUCOSE 124 (H) 04/30/2024 0739   BUN 13 04/30/2024 0739   CREATININE 1.01 04/30/2024 0739   CREATININE 1.10 06/03/2014 1914   CALCIUM 9.1 04/30/2024 0739   GFRNONAA >60 06/03/2014 1914   GFRAA >60 06/03/2014 1914   Lab Results  Component Value Date   HGBA1C 6.3 04/30/2024   HGBA1C 5.5 06/12/2018   No results found for: INSULIN  CBC    Component Value Date/Time   WBC 3.9 (L) 04/30/2024 0739   RBC 4.82 04/30/2024 0739   HGB 13.7 04/30/2024 0739   HCT 40.9 04/30/2024 0739   HCT 43.8 09/01/2021 0915   PLT 269.0 04/30/2024 0739   MCV 84.9 04/30/2024 0739   MCHC 33.5 04/30/2024 0739   RDW  14.5 04/30/2024 0739   Iron/TIBC/Ferritin/ %Sat No results found for: IRON, TIBC, FERRITIN, IRONPCTSAT Lipid Panel     Component Value Date/Time   CHOL 163 04/30/2024 0739   TRIG 88.0 04/30/2024 0739   HDL 42.20 04/30/2024 0739   CHOLHDL 4 04/30/2024 0739   VLDL 17.6 04/30/2024 0739   LDLCALC 103 (H) 04/30/2024 0739   Hepatic Function Panel     Component Value Date/Time   PROT 6.3 04/30/2024 0739   ALBUMIN 4.0 04/30/2024 0739   AST 13 04/30/2024 0739   ALT 14 04/30/2024 0739   ALKPHOS 45 04/30/2024 0739    BILITOT 0.4 04/30/2024 0739   BILIDIR 0.1 10/11/2010 0828      Component Value Date/Time   TSH 3.41 04/30/2024 0739     Assessment and Plan   Prediabetes  Low testosterone  in male  Chronic gout without tophus, unspecified cause, unspecified site  Vitamin B 12 deficiency  Obstructive sleep apnea  Vitamin D  deficiency  Hyperlipidemia, unspecified hyperlipidemia type  Morbid obesity (HCC), STARTING BMI 52.04    Assessment and Plan          ESTABLISH WITH [facility]      Obesity Treatment / Action Plan:  Patient will work on garnering support from family and friends to begin weight loss journey. Will work on eliminating or reducing the presence of highly palatable, calorie dense foods in the home. Will complete provided nutritional and psychosocial assessment questionnaire before the next appointment. Will be scheduled for indirect calorimetry to determine resting energy expenditure in a fasting state.  This will allow us  to create a reduced calorie, high-protein meal plan to promote loss of fat mass while preserving muscle mass. Counseled on the health benefits of losing 5%-15% of total body weight. Was counseled on nutritional approaches to weight loss and benefits of reducing processed foods and consuming plant-based foods and high quality protein as part of nutritional weight management. Was counseled on pharmacotherapy and role as an adjunct in weight management.   Obesity Education Performed Today:  He was weighed on the bioimpedance scale and results were discussed and documented in the synopsis.  We discussed obesity as a disease and the importance of a more detailed evaluation of all the factors contributing to the disease.  We discussed the importance of long term lifestyle changes which include nutrition, exercise and behavioral modifications as well as the importance of customizing this to his specific health and social needs.  We discussed the benefits of  reaching a healthier weight to alleviate the symptoms of existing conditions and reduce the risks of the biomechanical, metabolic and psychological effects of obesity.  We reviewed the four pillars of obesity medicine and importance of using a multimodal approach.  We reviewed the basic principles in weight management.   Yong M Mogle appears to be in the action stage of change and states they are ready to start intensive lifestyle modifications and behavioral modifications.  I have spent 30 minutes in the care of the patient today including: 5 minutes before the visit reviewing and preparing the chart. 20 minutes face-to-face assessing and reviewing listed medical problems as outlined in obesity care plan, providing nutritional and behavioral counseling on topics outlined in the obesity care plan, counseling regarding anti-obesity medication as outlined in obesity care plan, independently interpreting test results and goals of care, as described in assessment and plan, and reviewing and discussing biometric information and progress 5 minutes after the visit updating chart and documentation of encounter.  Reviewed  by clinician on day of visit: allergies, medications, problem list, medical history, surgical history, family history, social history, and previous encounter notes pertinent to obesity diagnosis.  Lorrin Nawrot d. Beryl Balz, NP-C

## 2024-06-14 DIAGNOSIS — Z419 Encounter for procedure for purposes other than remedying health state, unspecified: Secondary | ICD-10-CM | POA: Diagnosis not present

## 2024-07-15 DIAGNOSIS — Z419 Encounter for procedure for purposes other than remedying health state, unspecified: Secondary | ICD-10-CM | POA: Diagnosis not present

## 2024-07-28 ENCOUNTER — Encounter: Payer: Self-pay | Admitting: Emergency Medicine

## 2024-07-28 ENCOUNTER — Ambulatory Visit: Admission: EM | Admit: 2024-07-28 | Discharge: 2024-07-28 | Disposition: A | Discharge status: Home / Self Care

## 2024-07-28 ENCOUNTER — Ambulatory Visit: Payer: Self-pay

## 2024-07-28 DIAGNOSIS — R519 Headache, unspecified: Secondary | ICD-10-CM

## 2024-07-28 DIAGNOSIS — M7989 Other specified soft tissue disorders: Secondary | ICD-10-CM

## 2024-07-28 DIAGNOSIS — R42 Dizziness and giddiness: Secondary | ICD-10-CM | POA: Diagnosis not present

## 2024-07-28 NOTE — Discharge Instructions (Signed)
 Today you were evaluated for dizziness, pressure in your head and right leg swelling  Blood pressure and heart rate are within normal ranges  There is no abnormality to the scalp and the neck indicating cause  EKG shows that the heart is electrical rhythm is running in a normal pace and rhythm  Orthostatic vitals checking for changes in blood pressure with movement are all within normal ranges  There is no abnormality to your neurological exam  There is no abnormalities to the ears which are the place of equilibrium  Normal heart sounds are heard when you are listening to  At this time we do not have a cause for your symptoms please schedule a follow-up appointment with your primary doctor for further evaluation  At any point if your symptoms were to worsen please go to the nearest emergency department for reevaluation

## 2024-07-28 NOTE — Telephone Encounter (Signed)
 FYI Only or Action Required?: Action required by provider: Advised UC/ED, no available appts today.  Patient was last seen in primary care on 06/03/2024 by Jonel Rockie BIRCH, NP.  Called Nurse Triage reporting Dizziness.  Symptoms began several days ago.  Interventions attempted: Nothing.  Symptoms are: gradually worsening.  Triage Disposition: See Physician Within 24 Hours  Patient/caregiver understands and will follow disposition?: Yes      Copied from CRM #[id number]. Topic: Clinical - Red Word Triage >> Jul 28, 2024  3:13 PM Chiquita SQUIBB wrote: Red Word that prompted transfer to Nurse Triage: Patient had a issue today when he stood up he got dizzy and lightheaded, feels better now but when he clears his throat he is having severe pressure and pain in his head after that. Reason for Disposition  [1] MODERATE dizziness (e.g., interferes with normal activities) AND [2] has NOT been evaluated by doctor (or NP/PA) for this  (Exception: Dizziness caused by heat exposure, sudden standing, or poor fluid intake.)  Answer Assessment - Initial Assessment Questions Advised UC/ED. Patient reports will go to UC at [facility].   Patient reports 1x dizziness and HA, lasted 15 secs, when standing up during lunch. Patient reports pressure to back of head when coughing and clearing throat, started over the weekend.  Denies any head injury in past. Eating and drinking today. No new mediciations.   1. DESCRIPTION: Describe your dizziness.     today 2. LIGHTHEADED: Do you feel lightheaded? (e.g., somewhat faint, woozy, weak upon standing)     Feel dizziness, woozy 3. VERTIGO: Do you feel like either you or the room is spinning or tilting? (i.e., vertigo)     no 4. SEVERITY: How bad is it?  Do you feel like you are going to faint? Can you stand and walk?     yes 5. ONSET:  When did the dizziness begin?     today 6. AGGRAVATING FACTORS: Does anything make it worse? (e.g., standing,  change in head position)     standing 7. HEART RATE: Can you tell me your heart rate? How many beats in 15 seconds?  (Note: Not all patients can do this.)       80 8. CAUSE: What do you think is causing the dizziness? (e.g., decreased fluids or food, diarrhea, emotional distress, heat exposure, new medicine, sudden standing, vomiting; unknown)     It only happened 9. RECURRENT SYMPTOM: Have you had dizziness before? If Yes, ask: When was the last time? What happened that time?     Started Saturday and occurs with coughing or clearing throat 10. OTHER SYMPTOMS: Do you have any other symptoms? (e.g., fever, chest pain, vomiting, diarrhea, bleeding)      Bp 136/90hr 80, 97% o2  Denies blurred vision, difficulty speaking or walking, no chest pain, difficulty breathing  Right leg more swollen than left leg, denies calf muscle pain, swollen occurred Friday, previous sx, denies redness or warm to touch. Moderate swelling.  Protocols used: Dizziness - Lightheadedness-A-AH

## 2024-07-28 NOTE — ED Triage Notes (Signed)
 Patient reports that pressure radiating up the back of head when he clears his throat x 2 days ago.  Patient also reports 2 episodes of being dizzy that started today. Denies pain. Patient also reports swelling to right leg that is itchy.

## 2024-07-29 NOTE — ED Provider Notes (Signed)
 [name]    CSN: [id number] Arrival date & time: 07/28/24  1554      History   Chief Complaint Chief Complaint  Patient presents with   Dizziness   Leg Swelling    HPI David Hansen is a 52 y.o. male.   Patient presents for evaluation of posterior pressure to the head radiating to the anterior present only with clearing of the throat beginning 3 days ago.  Endorses right leg swelling beginning 3 days ago, denies erythema, pain, able to bear weight, denies injury.  Had 2 episodes of dizziness described as feeling woozy and unbalanced beginning today, occurring while standing lasting for approximately 15 seconds before spontaneous resolution.  Endorsing has not occurred prior.  Denies visual disturbance, chest pain, shortness of breath, wheezing, syncope, nasal congestion, ear pain, pressure or fullness, fever, headache.  Denies recent head injury.  Initiated testosterone  approximately 3 weeks ago, last injection completed 1 week ago.  Appetite unchanged.  Endorses over the weekend that he completed yard work and it was hot outside but he typically drinks approximately 60 to 100 ounces of water daily, has taken Advil without relief.  Past Medical History:  Diagnosis Date   Acute back pain 07/04/2012   Acute neck pain 07/04/2012   Chicken pox    Colon polyp 07/27/2011   Depression    Obesity    Sleep apnea 2008   CPAP    Patient Active Problem List   Diagnosis Date Noted   Gout 03/28/2022   Fatigue 05/11/2020   Prediabetes 06/12/2018   Preventative health care 12/27/2016   Left knee pain 05/15/2016   Prepatellar bursitis of right knee 04/25/2016   Dyshidrotic eczema 07/24/2014   Obstructive sleep apnea 09/05/2013   Family history of colon cancer in father 05/17/2011   Class 3 severe obesity due to excess calories with body mass index (BMI) of 50.0 to 59.9 in adult 05/20/2010    Past Surgical History:  Procedure Laterality Date   COLONOSCOPY  07/27/2011    Dr Aneita   COLONOSCOPY WITH PROPOFOL  N/A 07/24/2018   Procedure: COLONOSCOPY WITH PROPOFOL ;  Surgeon: Dessa Reyes ORN, MD;  Location: [facility] ENDOSCOPY;  Service: Endoscopy;  Laterality: N/A;   KNEE ARTHROSCOPY W/ ACL RECONSTRUCTION AND PATELLA GRAFT Right 1998   [facility] in [place]  Right knee       Home Medications    Prior to Admission medications   Medication Sig Start Date End Date Taking? Authorizing Provider  Testosterone  Enanthate (XYOSTED) 100 MG/0.5ML SOAJ 4 pre-filled syringes (Prefferd Testsoterone Enanthate, if not available then Cypionate is acceptable) for weekly [place] injection. 07/13/24  Yes [provider]  blood glucose meter kit and supplies KIT Dispense based on patient and insurance preference. Use up to four times daily as directed. (FOR ICD-9 250.00, 250.01). 05/03/21   Clark, Katherine K, NP  cholecalciferol (VITAMIN D3) 25 MCG (1000 UNIT) tablet Take 5,000 Units by mouth daily.    [provider]  Continuous Glucose Receiver (DEXCOM G7 RECEIVER) DEVI Use to check blood sugars. 05/09/24   Clark, Katherine K, NP  Continuous Glucose Sensor (DEXCOM G7 SENSOR) MISC Apply every 10 days to check blood sugars. 05/09/24   Clark, Katherine K, NP  indomethacin  (INDOCIN ) 50 MG capsule Take 1 capsule (50 mg total) by mouth 3 (three) times daily with meals. 05/02/24   Clark, Katherine K, NP  Lancets [facility] DELICA PLUS LANCET33G) MISC USE UP TO 4 TIMES A DAY 05/26/21   Gretta Crank  K, NP  magnesium 30 MG tablet Take 30 mg by mouth 2 (two) times daily.    [provider]  ONETOUCH VERIO test strip USE TO CHECK BLOOD GLUCOSE UP TO 4 TIMES A DAY 05/27/21   Gretta Comer POUR, NP    Family History Family History  Problem Relation Age of Onset   Cancer Father 34       colon cancer    Social History Social History   Tobacco Use   Smoking status: Never   Smokeless tobacco: Never  Substance Use Topics   Alcohol use: Yes    Alcohol/week: 0.0  standard drinks of alcohol    Comment: rarely   Drug use: No     Allergies   Patient has no known allergies.   Review of Systems Review of Systems  Neurological:  Positive for dizziness.     Physical Exam Triage Vital Signs ED Triage Vitals  Encounter Vitals Group     BP 07/28/24 1618 111/73     Girls Systolic BP Percentile --      Girls Diastolic BP Percentile --      Boys Systolic BP Percentile --      Boys Diastolic BP Percentile --      Pulse Rate 07/28/24 1618 73     Resp 07/28/24 1618 18     Temp 07/28/24 1618 98.5 F (36.9 C)     Temp Source 07/28/24 1618 Oral     SpO2 07/28/24 1618 97 %     Weight --      Height --      Head Circumference --      Peak Flow --      Pain Score 07/28/24 1615 0     Pain Loc --      Pain Education --      Exclude from Growth Chart --    Orthostatic VS for the past 24 hrs:  BP- Lying Pulse- Lying BP- Sitting Pulse- Sitting BP- Standing at 0 minutes Pulse- Standing at 0 minutes  07/28/24 1655 123/79 77 138/83 74 139/82 71    Updated Vital Signs BP 111/73 (BP Location: Left Arm)   Pulse 73   Temp 98.5 F (36.9 C) (Oral)   Resp 18   SpO2 97%   Visual Acuity Right Eye Distance:   Left Eye Distance:   Bilateral Distance:    Right Eye Near:   Left Eye Near:    Bilateral Near:     Physical Exam Constitutional:      Appearance: Normal appearance.  HENT:     Head: Normocephalic.     Right Ear: Tympanic membrane, ear canal and external ear normal.     Left Ear: Tympanic membrane, ear canal and external ear normal.     Nose: No congestion.     Right Sinus: No maxillary sinus tenderness or frontal sinus tenderness.     Left Sinus: No maxillary sinus tenderness or frontal sinus tenderness.  Eyes:     Extraocular Movements: Extraocular movements intact.  Cardiovascular:     Rate and Rhythm: Normal rate and regular rhythm.     Pulses: Normal pulses.     Heart sounds: Normal heart sounds.  Pulmonary:     Effort:  Pulmonary effort is normal.     Breath sounds: Normal breath sounds.  Musculoskeletal:     Right lower leg: Edema present.  Neurological:     General: No focal deficit present.     Mental Status: He  is alert and oriented to person, place, and time. Mental status is at baseline.     Cranial Nerves: No cranial nerve deficit.     Sensory: No sensory deficit.     Motor: No weakness.     Coordination: Coordination normal.     Gait: Gait normal.      UC Treatments / Results  Labs (all labs ordered are listed, but only abnormal results are displayed) Labs Reviewed - No data to display  EKG   Radiology No results found.  Procedures Procedures (including critical care time)  Medications Ordered in UC Medications - No data to display  Initial Impression / Assessment and Plan / UC Course  I have reviewed the triage vital signs and the nursing notes.  Pertinent labs & imaging results that were available during my care of the patient were reviewed by me and considered in my medical decision making (see chart for details).  Dizziness, pressure in the head, right leg swelling  Unknown etiology of symptoms, vital signs within normal range, S1 and S2 heard to auscultation and lungs are clear, no neurological deficits on exam, no abnormality to the ear nose or throat, EKG shows normal sinus rhythm, orthostatic vital signs negative, discussed all findings with patient, stable at this time for outpatient management, advised to closely monitor and schedule follow-up appointment with primary doctor and at any point if symptoms worsen or become more prominent he is to go to the nearest emergency department for further evaluation and management, verbalized understanding Final Clinical Impressions(s) / UC Diagnoses   Final diagnoses:  Dizziness  Pressure in head  Right leg swelling     Discharge Instructions      Today you were evaluated for dizziness, pressure in your head and right leg  swelling  Blood pressure and heart rate are within normal ranges  There is no abnormality to the scalp and the neck indicating cause  EKG shows that the heart is electrical rhythm is running in a normal pace and rhythm  Orthostatic vitals checking for changes in blood pressure with movement are all within normal ranges  There is no abnormality to your neurological exam  There is no abnormalities to the ears which are the place of equilibrium  Normal heart sounds are heard when you are listening to  At this time we do not have a cause for your symptoms please schedule a follow-up appointment with your primary doctor for further evaluation  At any point if your symptoms were to worsen please go to the nearest emergency department for reevaluation   ED Prescriptions   None    PDMP not reviewed this encounter.   Teresa Shelba SAUNDERS, [place] 07/29/24 [phone]

## 2024-07-30 NOTE — Telephone Encounter (Signed)
Noted, will review UC notes.

## 2024-07-30 NOTE — Telephone Encounter (Signed)
 Patient was seen at [facility] as recommended.

## 2024-08-15 DIAGNOSIS — Z419 Encounter for procedure for purposes other than remedying health state, unspecified: Secondary | ICD-10-CM | POA: Diagnosis not present

## 2024-09-12 ENCOUNTER — Ambulatory Visit: Payer: Self-pay | Admitting: Anesthesiology

## 2024-09-12 ENCOUNTER — Encounter: Payer: Self-pay | Admitting: Gastroenterology

## 2024-09-12 ENCOUNTER — Ambulatory Visit
Admission: RE | Admit: 2024-09-12 | Discharge: 2024-09-12 | Disposition: A | Discharge status: Home / Self Care | Attending: Gastroenterology | Admitting: Gastroenterology

## 2024-09-12 ENCOUNTER — Other Ambulatory Visit: Payer: Self-pay

## 2024-09-12 ENCOUNTER — Encounter
Admission: RE | Disposition: A | Discharge status: Home / Self Care | Payer: Self-pay | Source: Home / Self Care | Attending: Gastroenterology

## 2024-09-12 DIAGNOSIS — Z1211 Encounter for screening for malignant neoplasm of colon: Secondary | ICD-10-CM | POA: Diagnosis not present

## 2024-09-12 DIAGNOSIS — Z800 Family history of malignant neoplasm of digestive organs: Secondary | ICD-10-CM | POA: Insufficient documentation

## 2024-09-12 DIAGNOSIS — Z860101 Personal history of adenomatous and serrated colon polyps: Secondary | ICD-10-CM | POA: Diagnosis present

## 2024-09-12 DIAGNOSIS — Z6841 Body Mass Index (BMI) 40.0 and over, adult: Secondary | ICD-10-CM | POA: Insufficient documentation

## 2024-09-12 DIAGNOSIS — G4730 Sleep apnea, unspecified: Secondary | ICD-10-CM | POA: Insufficient documentation

## 2024-09-12 DIAGNOSIS — E66813 Obesity, class 3: Secondary | ICD-10-CM | POA: Diagnosis not present

## 2024-09-12 DIAGNOSIS — D128 Benign neoplasm of rectum: Secondary | ICD-10-CM | POA: Insufficient documentation

## 2024-09-12 HISTORY — DX: Prediabetes: R73.03

## 2024-09-12 HISTORY — PX: POLYPECTOMY: SHX149

## 2024-09-12 HISTORY — PX: COLONOSCOPY: SHX5424

## 2024-09-12 SURGERY — COLONOSCOPY
Anesthesia: General

## 2024-09-12 MED ORDER — DEXMEDETOMIDINE HCL IN NACL 80 MCG/20ML IV SOLN
INTRAVENOUS | Status: AC
  Filled 2024-09-12: qty 20

## 2024-09-12 MED ORDER — SODIUM CHLORIDE 0.9 % IV SOLN: INTRAVENOUS | Status: DC

## 2024-09-12 MED ORDER — PROPOFOL 1000 MG/100ML IV EMUL
INTRAVENOUS | Status: AC
  Filled 2024-09-12: qty 100

## 2024-09-12 MED ORDER — DEXMEDETOMIDINE HCL IN NACL 80 MCG/20ML IV SOLN
INTRAVENOUS | Status: DC | PRN
Start: 2024-09-12 — End: 2024-09-12
  Administered 2024-09-12: 12 ug via INTRAVENOUS
  Administered 2024-09-12: 8 ug via INTRAVENOUS

## 2024-09-12 MED ORDER — PROPOFOL 500 MG/50ML IV EMUL
INTRAVENOUS | Status: DC | PRN
  Administered 2024-09-12: 50 ug/kg/min via INTRAVENOUS

## 2024-09-12 MED ORDER — LIDOCAINE HCL (CARDIAC) PF 100 MG/5ML IV SOSY
PREFILLED_SYRINGE | INTRAVENOUS | Status: DC | PRN
  Administered 2024-09-12: 100 mg via INTRAVENOUS

## 2024-09-12 MED ORDER — LIDOCAINE HCL (PF) 2 % IJ SOLN
INTRAMUSCULAR | Status: AC
  Filled 2024-09-12: qty 5

## 2024-09-12 MED ORDER — PROPOFOL 10 MG/ML IV BOLUS
INTRAVENOUS | Status: DC | PRN
  Administered 2024-09-12 (×2): 50 mg via INTRAVENOUS

## 2024-09-12 NOTE — Op Note (Signed)
 [facility] Gastroenterology Patient Name: David Hansen Procedure Date: 09/12/2024 10:41 AM MRN: [id number] Account #: [id number] Date of Birth: 08-15-1971 Admit Type: Outpatient Age: 53 Room: [facility] ENDO ROOM 4 Gender: Male Note Status: Finalized Instrument Name: Colon Scope [id number] Procedure:             Colonoscopy Indications:           Surveillance: Personal history of adenomatous polyps                         on last colonoscopy > 3 years ago, Family history of                         colon cancer in a first-degree relative before age 53                         years, Last colonoscopy: August 2019 Providers:             Ruel Kung MD, MD Referring MD:          Comer LOIS Gaskins (Referring MD) Medicines:             Monitored Anesthesia Care Complications:         No immediate complications. Procedure:             Pre-Anesthesia Assessment:                        - Prior to the procedure, a History and Physical was                         performed, and patient medications, allergies and                         sensitivities were reviewed. The patient's tolerance                         of previous anesthesia was reviewed.                        - The risks and benefits of the procedure and the                         sedation options and risks were discussed with the                         patient. All questions were answered and informed                         consent was obtained.                        - ASA Grade Assessment: II - A patient with mild                         systemic disease.                        After obtaining informed consent, the colonoscope was  passed under direct vision. Throughout the procedure,                         the patient's blood pressure, pulse, and oxygen                         saturations were monitored continuously. The                         Colonoscope was introduced through the  anus and                         advanced to the the cecum, identified by the                         appendiceal orifice. The colonoscopy was performed                         with ease. The patient tolerated the procedure well.                         The quality of the bowel preparation was excellent.                         The ileocecal valve, appendiceal orifice, and rectum                         were photographed. Findings:      The perianal and digital rectal examinations were normal.      Two sessile polyps were found in the rectum. The polyps were 4 to 5 mm       in size. These polyps were removed with a cold snare. Resection and       retrieval were complete.      The exam was otherwise without abnormality on direct and retroflexion       views. Impression:            - Two 4 to 5 mm polyps in the rectum, removed with a                         cold snare. Resected and retrieved.                        - The examination was otherwise normal on direct and                         retroflexion views. Recommendation:        - Discharge patient to home (with escort).                        - Resume previous diet.                        - Continue present medications.                        - Await pathology results.                        - Repeat colonoscopy in  5 years for surveillance. Procedure Code(s):     --- Professional ---                        [phone], Colonoscopy, flexible; with removal of                         tumor(s), polyp(s), or other lesion(s) by snare                         technique Diagnosis Code(s):     --- Professional ---                        Z86.010, Personal history of colonic polyps                        D12.8, Benign neoplasm of rectum                        Z80.0, Family history of malignant neoplasm of                         digestive organs CPT copyright 2022 American Medical Association. All rights reserved. The codes documented in this  report are preliminary and upon coder review may  be revised to meet current compliance requirements. Ruel Kung, MD Ruel Kung MD, MD 09/12/2024 11:09:02 AM This report has been signed electronically. Number of Addenda: 0 Note Initiated On: 09/12/2024 10:41 AM Scope Withdrawal Time: 0 hours 10 minutes 10 seconds  Total Procedure Duration: 0 hours 14 minutes 51 seconds  Estimated Blood Loss:  Estimated blood loss: none.      [facility]

## 2024-09-12 NOTE — Anesthesia Postprocedure Evaluation (Signed)
 Anesthesia Post Note  Patient: David Hansen  Procedure(s) Performed: COLONOSCOPY POLYPECTOMY, INTESTINE  Patient location during evaluation: PACU Anesthesia Type: General Level of consciousness: awake and alert Pain management: pain level controlled Vital Signs Assessment: post-procedure vital signs reviewed and stable Respiratory status: spontaneous breathing, nonlabored ventilation and respiratory function stable Cardiovascular status: blood pressure returned to baseline and stable Postop Assessment: no apparent nausea or vomiting Anesthetic complications: no   No notable events documented.   Last Vitals:  Vitals:   09/12/24 1109 09/12/24 1119  BP: 99/67 117/84  Pulse: 71 77  Resp: 14 16  Temp:    SpO2: 99% 96%    Last Pain:  Vitals:   09/12/24 1119  TempSrc:   PainSc: 0-No pain                 Camellia Merilee Louder

## 2024-09-12 NOTE — Anesthesia Preprocedure Evaluation (Addendum)
 Anesthesia Evaluation  Patient identified by MRN, date of birth, ID band Patient awake    Reviewed: Allergy & Precautions, H&P , NPO status , Patient's Chart, lab work & pertinent test results  Airway Mallampati: II  TM Distance: >3 FB Neck ROM: full    Dental no notable dental hx.    Pulmonary sleep apnea    Pulmonary exam normal        Cardiovascular negative cardio ROS Normal cardiovascular exam     Neuro/Psych  PSYCHIATRIC DISORDERS  Depression    negative neurological ROS     GI/Hepatic negative GI ROS, Neg liver ROS,,,  Endo/Other    Class 3 obesity  Renal/GU negative Renal ROS  negative genitourinary   Musculoskeletal   Abdominal  (+) + obese  Peds  Hematology negative hematology ROS (+)   Anesthesia Other Findings Past Medical History: 07/04/2012: Acute back pain 07/04/2012: Acute neck pain No date: Chicken pox 07/27/2011: Colon polyp No date: Depression No date: Obesity 2008: Sleep apnea     Comment:  CPAP  Past Surgical History: 07/27/2011: COLONOSCOPY     Comment:  Dr Aneita 07/24/2018: COLONOSCOPY WITH PROPOFOL ; N/A     Comment:  Procedure: COLONOSCOPY WITH PROPOFOL ;  Surgeon: Dessa Reyes ORN, MD;  Location: [facility] ENDOSCOPY;  Service:               Endoscopy;  Laterality: N/A; 1998: KNEE ARTHROSCOPY W/ ACL RECONSTRUCTION AND PATELLA GRAFT; Right     Comment:  [facility] in [place]  Right knee  BMI    Body Mass Index: 51.63 kg/m      Reproductive/Obstetrics negative OB ROS                              Anesthesia Physical Anesthesia Plan  ASA: 3  Anesthesia Plan: General   Post-op Pain Management:    Induction:   PONV Risk Score and Plan: Propofol  infusion and TIVA  Airway Management Planned: Natural Airway  Additional Equipment:   Intra-op Plan:   Post-operative Plan:   Informed Consent: I have reviewed the patients History and  Physical, chart, labs and discussed the procedure including the risks, benefits and alternatives for the proposed anesthesia with the patient or authorized representative who has indicated his/her understanding and acceptance.     Dental Advisory Given  Plan Discussed with: CRNA and Surgeon  Anesthesia Plan Comments:          Anesthesia Quick Evaluation

## 2024-09-12 NOTE — Transfer of Care (Signed)
 Immediate Anesthesia Transfer of Care Note  Patient: David Hansen  Procedure(s) Performed: COLONOSCOPY POLYPECTOMY, INTESTINE  Patient Location: PACU  Anesthesia Type:General  Level of Consciousness: sedated  Airway & Oxygen Therapy: Patient Spontanous Breathing  Post-op Assessment: Report given to RN and Post -op Vital signs reviewed and stable  Post vital signs: Reviewed and stable  Last Vitals:  Vitals Value Taken Time  BP 99/67 09/12/24 11:09  Temp    Pulse 71 09/12/24 11:09  Resp 14 09/12/24 11:09  SpO2 99 % 09/12/24 11:09    Last Pain:  Vitals:   09/12/24 1109  TempSrc:   PainSc: Asleep         Complications: No notable events documented.

## 2024-09-12 NOTE — H&P (Signed)
 David Hansen , MD [address], [place], [place], [address] Phone: [phone] Fax: [phone]  Primary Care Physician:  Gretta Comer POUR, NP   Pre-Procedure History & Physical: HPI:  David Hansen is a 53 y.o. male is here for an colonoscopy.   Past Medical History:  Diagnosis Date   Acute back pain 07/04/2012   Acute neck pain 07/04/2012   Chicken pox    Colon polyp 07/27/2011   Depression    Obesity    Sleep apnea 2008   CPAP    Past Surgical History:  Procedure Laterality Date   COLONOSCOPY  07/27/2011   Dr Aneita   COLONOSCOPY WITH PROPOFOL  N/A 07/24/2018   Procedure: COLONOSCOPY WITH PROPOFOL ;  Surgeon: Dessa Reyes ORN, MD;  Location: [facility] ENDOSCOPY;  Service: Endoscopy;  Laterality: N/A;   KNEE ARTHROSCOPY W/ ACL RECONSTRUCTION AND PATELLA GRAFT Right 1998   [facility] in [place]  Right knee    Prior to Admission medications   Medication Sig Start Date End Date Taking? Authorizing Provider  cholecalciferol (VITAMIN D3) 25 MCG (1000 UNIT) tablet Take 5,000 Units by mouth daily.   Yes [provider]  indomethacin  (INDOCIN ) 50 MG capsule Take 1 capsule (50 mg total) by mouth 3 (three) times daily with meals. 05/02/24  Yes Clark, Katherine K, NP  magnesium 30 MG tablet Take 30 mg by mouth 2 (two) times daily.   Yes [provider]  blood glucose meter kit and supplies KIT Dispense based on patient and insurance preference. Use up to four times daily as directed. (FOR ICD-9 250.00, 250.01). 05/03/21   Gretta Comer POUR, NP  Continuous Glucose Receiver (DEXCOM G7 RECEIVER) DEVI Use to check blood sugars. 05/09/24   Clark, Katherine K, NP  Continuous Glucose Sensor (DEXCOM G7 SENSOR) MISC Apply every 10 days to check blood sugars. 05/09/24   Clark, Katherine K, NP  Lancets [facility] DELICA PLUS [place]) MISC USE UP TO 4 TIMES A DAY 05/26/21   Gretta Comer POUR, NP  ONETOUCH VERIO test strip USE TO CHECK BLOOD GLUCOSE UP TO 4 TIMES  A DAY 05/27/21   Gretta Comer POUR, NP  Testosterone  Enanthate (XYOSTED) 100 MG/0.5ML SOAJ 4 pre-filled syringes (Prefferd Testsoterone Enanthate, if not available then Cypionate is acceptable) for weekly [place] injection. 07/13/24   [provider]    Allergies as of 09/01/2024   (No Known Allergies)    Family History  Problem Relation Age of Onset   Cancer Father 32       colon cancer    Social History   Socioeconomic History   Marital status: Married    Spouse name: Not on file   Number of children: 2   Years of education: Not on file   Highest education level: Not on file  Occupational History   Occupation: Comptroller  Tobacco Use   Smoking status: Never   Smokeless tobacco: Never  Substance and Sexual Activity   Alcohol use: Yes    Alcohol/week: 0.0 standard drinks of alcohol    Comment: rarely   Drug use: No   Sexual activity: Not on file  Other Topics Concern   Not on file  Social History Narrative   Regular exercise yes 2-3 times a week...walking      Diet: fruits and veggies,lean protein, water   Social Drivers of Health   Financial Resource Strain: Not on file  Food Insecurity: Low Risk  (06/30/2024)   Received from [facility]  Hunger Vital Sign    Within the past 12 months, you worried that your food would run out before you got money to buy more: Never true    Within the past 12 months, the food you bought just didn't last and you didn't have money to get more. : Never true  Transportation Needs: No Transportation Needs (06/30/2024)   Received from [employer]    In the past 12 months, has lack of reliable transportation kept you from medical appointments, meetings, work or from getting things needed for daily living? : No  Physical Activity: Not on file  Stress: Not on file  Social Connections: Not on file  Intimate Partner Violence: Not on file    Review of Systems: See HPI, otherwise negative ROS  Physical  Exam: Ht 5' 10.5 (1.791 m)   Wt (!) 165.6 kg   BMI 51.63 kg/m  General:   Alert,  pleasant and cooperative in NAD Head:  Normocephalic and atraumatic. Neck:  Supple; no masses or thyromegaly. Lungs:  Clear throughout to auscultation, normal respiratory effort.    Heart:  +S1, +S2, Regular rate and rhythm, No edema. Abdomen:  Soft, nontender and nondistended. Normal bowel sounds, without guarding, and without rebound.   Neurologic:  Alert and  oriented x4;  grossly normal neurologically.  Impression/Plan: David Hansen is here for an colonoscopy to be performed for surveillance due to prior history of colon polyps , family history of colon polyps  Risks, benefits, limitations, and alternatives regarding  colonoscopy have been reviewed with the patient.  Questions have been answered.  All parties agreeable.   David Kung, MD  09/12/2024, 9:56 AM

## 2024-09-15 LAB — SURGICAL PATHOLOGY

## 2024-09-19 ENCOUNTER — Ambulatory Visit: Admit: 2024-09-19 | Admitting: Gastroenterology

## 2024-09-19 SURGERY — COLONOSCOPY
Anesthesia: General

## 2024-10-04 ENCOUNTER — Ambulatory Visit
Admission: EM | Admit: 2024-10-04 | Discharge: 2024-10-04 | Disposition: A | Discharge status: Home / Self Care | Attending: Emergency Medicine | Admitting: Emergency Medicine

## 2024-10-04 DIAGNOSIS — J069 Acute upper respiratory infection, unspecified: Secondary | ICD-10-CM | POA: Diagnosis not present

## 2024-10-04 LAB — POC COVID19/FLU A&B COMBO
Covid Antigen, POC: NEGATIVE
Influenza A Antigen, POC: NEGATIVE
Influenza B Antigen, POC: NEGATIVE

## 2024-10-04 MED ORDER — PREDNISONE 10 MG (21) PO TBPK: ORAL_TABLET | Freq: Every day | ORAL | 0 refills | Status: DC

## 2024-10-04 NOTE — Discharge Instructions (Addendum)
 The COVID and flu tests are negative.   Take the prednisone  taper as directed.   Take Tylenol or ibuprofen as needed for fever or discomfort.  Take plain Mucinex as needed for congestion.  Rest and keep yourself hydrated.    Follow-up with your primary care provider if your symptoms are not improving.

## 2024-10-04 NOTE — ED Triage Notes (Signed)
 Pt reports he has a runny nose, sore throat, and cough x 1 day   Took tylenol allergy which gave some relief

## 2024-10-04 NOTE — ED Provider Notes (Signed)
 David Hansen    CSN: [id number] Arrival date & time: 10/04/24  9148      History   Chief Complaint Chief Complaint  Patient presents with   Nasal Congestion    HPI David Hansen is a 53 y.o. male.  Patient presents with 1 day history of runny nose, congestion, postnasal drip, sore throat, cough.  No fever, chest pain, shortness of breath, vomiting, diarrhea.  Treatment attempted with OTC cold and sinus medication.  He uses CPAP at night and states the congestion is interfering with his sleep.  The history is provided by the patient and medical records.    Past Medical History:  Diagnosis Date   Acute back pain 07/04/2012   Acute neck pain 07/04/2012   Chicken pox    Colon polyp 07/27/2011   Depression    Obesity    Pre-diabetes    Sleep apnea 2008   CPAP    Patient Active Problem List   Diagnosis Date Noted   Gout 03/28/2022   Fatigue 05/11/2020   Prediabetes 06/12/2018   Preventative health care 12/27/2016   Left knee pain 05/15/2016   Prepatellar bursitis of right knee 04/25/2016   Dyshidrotic eczema 07/24/2014   Obstructive sleep apnea 09/05/2013   Family history of colon cancer in father 05/17/2011   Class 3 severe obesity due to excess calories with body mass index (BMI) of 50.0 to 59.9 in adult [facility]) 05/20/2010    Past Surgical History:  Procedure Laterality Date   ANKLE FRACTURE SURGERY Right 1998   COLONOSCOPY  07/27/2011   Dr Aneita   COLONOSCOPY N/A 09/12/2024   Procedure: COLONOSCOPY;  Surgeon: Therisa Bi, MD;  Location: [facility] ENDOSCOPY;  Service: Endoscopy;  Laterality: N/A;   COLONOSCOPY WITH PROPOFOL  N/A 07/24/2018   Procedure: COLONOSCOPY WITH PROPOFOL ;  Surgeon: Dessa Reyes ORN, MD;  Location: [facility] ENDOSCOPY;  Service: Endoscopy;  Laterality: N/A;   KNEE ARTHROSCOPY W/ ACL RECONSTRUCTION AND PATELLA GRAFT Right 1998   [facility] in [place]  Right knee   POLYPECTOMY  09/12/2024   Procedure: POLYPECTOMY, INTESTINE;   Surgeon: Therisa Bi, MD;  Location: [facility] ENDOSCOPY;  Service: Endoscopy;;       Home Medications    Prior to Admission medications   Medication Sig Start Date End Date Taking? Authorizing Provider  predniSONE  (STERAPRED UNI-PAK 21 TAB) 10 MG (21) TBPK tablet Take by mouth daily. As directed 10/04/24  Yes Corlis Burnard DEL, NP  blood glucose meter kit and supplies KIT Dispense based on patient and insurance preference. Use up to four times daily as directed. (FOR ICD-9 250.00, 250.01). 05/03/21   Clark, Katherine K, NP  cholecalciferol (VITAMIN D3) 25 MCG (1000 UNIT) tablet Take 5,000 Units by mouth daily.    [provider]  Continuous Glucose Receiver (DEXCOM G7 RECEIVER) DEVI Use to check blood sugars. 05/09/24   Clark, Katherine K, NP  Continuous Glucose Sensor (DEXCOM G7 SENSOR) MISC Apply every 10 days to check blood sugars. 05/09/24   Clark, Katherine K, NP  indomethacin  (INDOCIN ) 50 MG capsule Take 1 capsule (50 mg total) by mouth 3 (three) times daily with meals. 05/02/24   Clark, Katherine K, NP  Lancets [facility] DELICA PLUS LANCET33G) MISC USE UP TO 4 TIMES A DAY 05/26/21   Clark, Katherine K, NP  magnesium 30 MG tablet Take 30 mg by mouth 2 (two) times daily.    [provider]  ONETOUCH VERIO test strip USE TO CHECK BLOOD GLUCOSE UP TO 4 TIMES A  DAY 05/27/21   Clark, Katherine K, NP  Testosterone  Enanthate (XYOSTED) 100 MG/0.5ML SOAJ 4 pre-filled syringes (Prefferd Testsoterone Enanthate, if not available then Cypionate is acceptable) for weekly [place] injection. 07/13/24   [provider]    Family History Family History  Problem Relation Age of Onset   Cancer Father 39       colon cancer    Social History Social History   Tobacco Use   Smoking status: Never   Smokeless tobacco: Never  Vaping Use   Vaping status: Never Used  Substance Use Topics   Alcohol use: Yes    Alcohol/week: 0.0 standard drinks of alcohol    Comment: rarely   Drug use: No      Allergies   Patient has no known allergies.   Review of Systems Review of Systems  Constitutional:  Negative for chills and fever.  HENT:  Positive for congestion, postnasal drip, rhinorrhea and sore throat. Negative for ear pain.   Respiratory:  Positive for cough. Negative for shortness of breath.   Cardiovascular:  Negative for chest pain and palpitations.  Gastrointestinal:  Negative for diarrhea and vomiting.     Physical Exam Triage Vital Signs ED Triage Vitals  Encounter Vitals Group     BP      Girls Systolic BP Percentile      Girls Diastolic BP Percentile      Boys Systolic BP Percentile      Boys Diastolic BP Percentile      Pulse      Resp      Temp      Temp src      SpO2      Weight      Height      Head Circumference      Peak Flow      Pain Score      Pain Loc      Pain Education      Exclude from Growth Chart    No data found.  Updated Vital Signs BP 129/86   Pulse 78   Temp (!) 97.5 F (36.4 C) (Oral)   Resp 18   SpO2 96%   Visual Acuity Right Eye Distance:   Left Eye Distance:   Bilateral Distance:    Right Eye Near:   Left Eye Near:    Bilateral Near:     Physical Exam Constitutional:      General: He is not in acute distress. HENT:     Right Ear: Tympanic membrane normal.     Left Ear: Tympanic membrane normal.     Nose: Rhinorrhea present.     Mouth/Throat:     Mouth: Mucous membranes are moist.     Pharynx: Oropharynx is clear.  Cardiovascular:     Rate and Rhythm: Normal rate and regular rhythm.     Heart sounds: Normal heart sounds.  Pulmonary:     Effort: Pulmonary effort is normal. No respiratory distress.     Breath sounds: Normal breath sounds.  Neurological:     Mental Status: He is alert.      UC Treatments / Results  Labs (all labs ordered are listed, but only abnormal results are displayed) Labs Reviewed  POC COVID19/FLU A&B COMBO - Normal    EKG   Radiology No results  found.  Procedures Procedures (including critical care time)  Medications Ordered in UC Medications - No data to display  Initial Impression / Assessment and Plan / UC Course  I have reviewed the triage vital signs and the nursing notes.  Pertinent labs & imaging results that were available during my care of the patient were reviewed by me and considered in my medical decision making (see chart for details).    Viral URI.  Afebrile and vital signs are stable.  Lungs are clear and O2 sat is 96% on room air.  COVID and flu tests are negative.  Treating with prednisone  taper today as patient's congestion is interfering with his sleep when using his CPAP.  Discussed other symptomatic treatment including nasal steroid spray, Tylenol or ibuprofen as needed.  Plain Mucinex as needed.  Instructed him to follow-up with his PCP if he is not improving.  Education provided on viral respiratory infection.  He agrees to plan of care.  Final Clinical Impressions(s) / UC Diagnoses   Final diagnoses:  Viral URI     Discharge Instructions      The COVID and flu tests are negative.   Take the prednisone  taper as directed.   Take Tylenol or ibuprofen as needed for fever or discomfort.  Take plain Mucinex as needed for congestion.  Rest and keep yourself hydrated.    Follow-up with your primary care provider if your symptoms are not improving.         ED Prescriptions     Medication Sig Dispense Auth. Provider   predniSONE  (STERAPRED UNI-PAK 21 TAB) 10 MG (21) TBPK tablet Take by mouth daily. As directed 21 tablet Corlis Burnard DEL, NP      PDMP not reviewed this encounter.   Corlis Burnard DEL, NP 10/04/24 [phone]

## 2024-12-01 ENCOUNTER — Ambulatory Visit: Admission: EM | Admit: 2024-12-01 | Discharge: 2024-12-01 | Disposition: A | Discharge status: Home / Self Care

## 2024-12-01 ENCOUNTER — Encounter: Payer: Self-pay | Admitting: Emergency Medicine

## 2024-12-01 DIAGNOSIS — B349 Viral infection, unspecified: Secondary | ICD-10-CM

## 2024-12-01 MED ORDER — PREDNISONE 10 MG (21) PO TBPK: ORAL_TABLET | Freq: Every day | ORAL | 0 refills | Status: AC

## 2024-12-01 NOTE — Discharge Instructions (Addendum)
 Your symptoms today are most likely being caused by a virus and should steadily improve in time it can take up to 7 to 10 days before you truly start to see a turnaround however things will get better  Begin prednisone  every morning with food to help reduce sinus pressure and reduce inflammation throughout the nasal and sinus cavity, avoid ibuprofen while taking you may use Tylenol if needed    You can take Tylenol as needed for fever reduction and pain relief.   For cough: honey 1/2 to 1 teaspoon (you can dilute the honey in water or another fluid).  You can also use guaifenesin and dextromethorphan for cough. You can use a humidifier for chest congestion and cough.  If you don't have a humidifier, you can sit in the bathroom with the hot shower running.      For sore throat: try warm salt water gargles, cepacol lozenges, throat spray, warm tea or water with lemon/honey, popsicles or ice, or OTC cold relief medicine for throat discomfort.   For congestion: take a daily anti-histamine like Zyrtec, Claritin, and a oral decongestant, such as pseudoephedrine.  You can also use Flonase  1-2 sprays in each nostril daily.   It is important to stay hydrated: drink plenty of fluids (water, gatorade/powerade/pedialyte, juices, or teas) to keep your throat moisturized and help further relieve irritation/discomfort.

## 2024-12-01 NOTE — ED Provider Notes (Signed)
 " David Hansen    CSN: [id number] Arrival date & time: 12/01/24  9178      History   Chief Complaint Chief Complaint  Patient presents with   Cough   Nasal Congestion    HPI David Hansen is a 53 y.o. male.   Patient presents for evaluation of nasal congestion and a productive cough present for 3 days.  Associated sinus pressure to the bridge of the nose and a dry and scratchy throat.  No known sick contact prior.  Tolerable to food and liquids.  Has attempted use of Mucinex.  Denies fever, shortness of breath or wheezing.  Uses CPAP at night, non-smoker.   Past Medical History:  Diagnosis Date   Acute back pain 07/04/2012   Acute neck pain 07/04/2012   Chicken pox    Colon polyp 07/27/2011   Depression    Obesity    Pre-diabetes    Sleep apnea 2008   CPAP    Patient Active Problem List   Diagnosis Date Noted   Gout 03/28/2022   Fatigue 05/11/2020   Prediabetes 06/12/2018   Preventative health care 12/27/2016   Left knee pain 05/15/2016   Prepatellar bursitis of right knee 04/25/2016   Dyshidrotic eczema 07/24/2014   Obstructive sleep apnea 09/05/2013   Family history of colon cancer in father 05/17/2011   Class 3 severe obesity due to excess calories with body mass index (BMI) of 50.0 to 59.9 in adult [facility]) 05/20/2010    Past Surgical History:  Procedure Laterality Date   ANKLE FRACTURE SURGERY Right 1998   COLONOSCOPY  07/27/2011   Dr Aneita   COLONOSCOPY N/A 09/12/2024   Procedure: COLONOSCOPY;  Surgeon: Therisa Bi, MD;  Location: [facility] ENDOSCOPY;  Service: Endoscopy;  Laterality: N/A;   COLONOSCOPY WITH PROPOFOL  N/A 07/24/2018   Procedure: COLONOSCOPY WITH PROPOFOL ;  Surgeon: Dessa Reyes ORN, MD;  Location: [facility] ENDOSCOPY;  Service: Endoscopy;  Laterality: N/A;   KNEE ARTHROSCOPY W/ ACL RECONSTRUCTION AND PATELLA GRAFT Right 1998   [facility] in [place]  Right knee   POLYPECTOMY  09/12/2024   Procedure: POLYPECTOMY, INTESTINE;   Surgeon: Therisa Bi, MD;  Location: [facility] ENDOSCOPY;  Service: Endoscopy;;       Home Medications    Prior to Admission medications  Medication Sig Start Date End Date Taking? Authorizing Provider  anastrozole (ARIMIDEX) 1 MG tablet Take 1 mg by mouth daily. 10/11/24 10/11/25 Yes [provider]  blood glucose meter kit and supplies KIT Dispense based on patient and insurance preference. Use up to four times daily as directed. (FOR ICD-9 250.00, 250.01). 05/03/21   Clark, Katherine K, NP  cholecalciferol (VITAMIN D3) 25 MCG (1000 UNIT) tablet Take 5,000 Units by mouth daily.    [provider]  Continuous Glucose Receiver (DEXCOM G7 RECEIVER) DEVI Use to check blood sugars. 05/09/24   Clark, Katherine K, NP  Continuous Glucose Sensor (DEXCOM G7 SENSOR) MISC Apply every 10 days to check blood sugars. 05/09/24   Clark, Katherine K, NP  indomethacin  (INDOCIN ) 50 MG capsule Take 1 capsule (50 mg total) by mouth 3 (three) times daily with meals. 05/02/24   Clark, Katherine K, NP  Lancets [facility] DELICA PLUS LANCET33G) MISC USE UP TO 4 TIMES A DAY 05/26/21   Clark, Katherine K, NP  magnesium 30 MG tablet Take 30 mg by mouth 2 (two) times daily.    [provider]  ONETOUCH VERIO test strip USE TO CHECK BLOOD GLUCOSE UP TO 4 TIMES A  DAY 05/27/21   Clark, Katherine K, NP  predniSONE  (STERAPRED UNI-PAK 21 TAB) 10 MG (21) TBPK tablet Take by mouth daily. As directed 10/04/24   Corlis Burnard DEL, NP  Testosterone  Enanthate (XYOSTED) 100 MG/0.5ML SOAJ 4 pre-filled syringes (Prefferd Testsoterone Enanthate, if not available then Cypionate is acceptable) for weekly  injection. 07/13/24   [provider]    Family History Family History  Problem Relation Age of Onset   Cancer Father 58       colon cancer    Social History Social History[1]   Allergies   Patient has no known allergies.   Review of Systems Review of Systems  Constitutional: Negative.   HENT:  Positive  for congestion. Negative for dental problem, drooling, ear discharge, ear pain, facial swelling, hearing loss, mouth sores, nosebleeds, postnasal drip, rhinorrhea, sinus pressure, sinus pain, sneezing, sore throat, tinnitus, trouble swallowing and voice change.   Respiratory:  Positive for cough. Negative for apnea, choking, chest tightness, shortness of breath, wheezing and stridor.   Gastrointestinal: Negative.      Physical Exam Triage Vital Signs ED Triage Vitals  Encounter Vitals Group     BP 12/01/24 0919 134/82     Girls Systolic BP Percentile --      Girls Diastolic BP Percentile --      Boys Systolic BP Percentile --      Boys Diastolic BP Percentile --      Pulse Rate 12/01/24 0919 78     Resp 12/01/24 0919 18     Temp 12/01/24 0919 97.9 F (36.6 C)     Temp Source 12/01/24 0919 Oral     SpO2 12/01/24 0917 94 %     Weight --      Height --      Head Circumference --      Peak Flow --      Pain Score 12/01/24 0917 0     Pain Loc --      Pain Education --      Exclude from Growth Chart --    No data found.  Updated Vital Signs BP 134/82 (BP Location: Right Arm)   Pulse 78   Temp 97.9 F (36.6 C) (Oral)   Resp 18   SpO2 94%   Visual Acuity Right Eye Distance:   Left Eye Distance:   Bilateral Distance:    Right Eye Near:   Left Eye Near:    Bilateral Near:     Physical Exam Constitutional:      Appearance: Normal appearance.  HENT:     Right Ear: Tympanic membrane, ear canal and external ear normal.     Left Ear: Tympanic membrane, ear canal and external ear normal.     Nose: Congestion present.     Right Sinus: No maxillary sinus tenderness or frontal sinus tenderness.     Left Sinus: No maxillary sinus tenderness or frontal sinus tenderness.     Mouth/Throat:     Pharynx: No oropharyngeal exudate or posterior oropharyngeal erythema.  Cardiovascular:     Rate and Rhythm: Normal rate and regular rhythm.     Pulses: Normal pulses.     Heart sounds:  Normal heart sounds.  Pulmonary:     Effort: Pulmonary effort is normal.     Breath sounds: Normal breath sounds.  Musculoskeletal:     Cervical back: Normal range of motion and neck supple.  Lymphadenopathy:     Cervical: No cervical adenopathy.  Neurological:  Mental Status: He is alert and oriented to person, place, and time. Mental status is at baseline.      UC Treatments / Results  Labs (all labs ordered are listed, but only abnormal results are displayed) Labs Reviewed  POCT INFLUENZA A/B  POC SOFIA SARS ANTIGEN FIA    EKG   Radiology No results found.  Procedures Procedures (including critical care time)  Medications Ordered in UC Medications - No data to display  Initial Impression / Assessment and Plan / UC Course  I have reviewed the triage vital signs and the nursing notes.  Pertinent labs & imaging results that were available during my care of the patient were reviewed by me and considered in my medical decision making (see chart for details).  Viral illness  Patient is in no signs of distress nor toxic appearing.  Vital signs are stable.  Low suspicion for pneumonia, pneumothorax or bronchitis and therefore will defer imaging.  Declined viral testing.  Prescribed prednisone , declined cough medication.May use additional over-the-counter medications as needed for supportive care.  May follow-up with urgent care as needed if symptoms persist or worsen.   Final Clinical Impressions(s) / UC Diagnoses   Final diagnoses:  Viral illness   Discharge Instructions   None    ED Prescriptions   None    PDMP not reviewed this encounter.     [1]  Social History Tobacco Use   Smoking status: Never   Smokeless tobacco: Never  Vaping Use   Vaping status: Never Used  Substance Use Topics   Alcohol use: Yes    Alcohol/week: 0.0 standard drinks of alcohol    Comment: rarely   Drug use: No     Teresa Shelba SAUNDERS, NP 12/01/24 [phone]  "

## 2024-12-01 NOTE — ED Triage Notes (Signed)
 Patient complains of cough, runny nose and x 3 days. Patient has taken mucinex with mild relief.
